# Patient Record
Sex: Female | Born: 1980 | Race: White | Hispanic: No | Marital: Married | Smoking: Never smoker
Health system: Southern US, Community
[De-identification: ages and names within clinical notes are randomized; demographics above are authoritative.]

## PROBLEM LIST (undated history)

## (undated) DIAGNOSIS — Z9889 Other specified postprocedural states: Secondary | ICD-10-CM

## (undated) DIAGNOSIS — R112 Nausea with vomiting, unspecified: Secondary | ICD-10-CM

## (undated) DIAGNOSIS — E7211 Homocystinuria: Secondary | ICD-10-CM

## (undated) DIAGNOSIS — R519 Headache, unspecified: Secondary | ICD-10-CM

## (undated) DIAGNOSIS — E7212 Methylenetetrahydrofolate reductase deficiency: Secondary | ICD-10-CM

## (undated) DIAGNOSIS — R51 Headache: Secondary | ICD-10-CM

## (undated) DIAGNOSIS — O039 Complete or unspecified spontaneous abortion without complication: Secondary | ICD-10-CM

## (undated) HISTORY — PX: WISDOM TOOTH EXTRACTION: SHX21

## (undated) HISTORY — DX: Headache, unspecified: R51.9

## (undated) HISTORY — DX: Headache: R51

## (undated) HISTORY — DX: Methylenetetrahydrofolate reductase deficiency: E72.12

## (undated) HISTORY — DX: Methylenetetrahydrofolate reductase deficiency: E72.11

## (undated) HISTORY — DX: Complete or unspecified spontaneous abortion without complication: O03.9

---

## 2013-01-31 ENCOUNTER — Other Ambulatory Visit: Payer: Self-pay | Admitting: Obstetrics and Gynecology

## 2013-02-01 ENCOUNTER — Encounter (HOSPITAL_COMMUNITY): Payer: Self-pay

## 2013-02-01 ENCOUNTER — Encounter (HOSPITAL_COMMUNITY)
Admission: RE | Disposition: A | Discharge status: Home / Self Care | Payer: Self-pay | Source: Ambulatory Visit | Attending: Obstetrics and Gynecology

## 2013-02-01 ENCOUNTER — Ambulatory Visit (HOSPITAL_COMMUNITY)
Admission: RE | Admit: 2013-02-01 | Discharge: 2013-02-01 | Disposition: A | Discharge status: Home / Self Care | Payer: BC Managed Care – PPO | Source: Ambulatory Visit | Attending: Obstetrics and Gynecology | Admitting: Obstetrics and Gynecology

## 2013-02-01 ENCOUNTER — Ambulatory Visit (HOSPITAL_COMMUNITY): Payer: BC Managed Care – PPO

## 2013-02-01 DIAGNOSIS — O021 Missed abortion: Secondary | ICD-10-CM | POA: Insufficient documentation

## 2013-02-01 HISTORY — DX: Other specified postprocedural states: Z98.890

## 2013-02-01 HISTORY — DX: Nausea with vomiting, unspecified: R11.2

## 2013-02-01 HISTORY — PX: DILATION AND EVACUATION: SHX1459

## 2013-02-01 SURGERY — DILATION AND EVACUATION, UTERUS
Anesthesia: Monitor Anesthesia Care | Site: Uterus | Wound class: Clean Contaminated

## 2013-02-01 MED ORDER — DEXAMETHASONE SODIUM PHOSPHATE 4 MG/ML IJ SOLN
INTRAMUSCULAR | Status: DC | PRN
  Administered 2013-02-01: 10 mg via INTRAVENOUS

## 2013-02-01 MED ORDER — METHYLERGONOVINE MALEATE 0.2 MG/ML IJ SOLN
INTRAMUSCULAR | Status: AC
  Filled 2013-02-01: qty 1

## 2013-02-01 MED ORDER — DEXAMETHASONE SODIUM PHOSPHATE 10 MG/ML IJ SOLN
INTRAMUSCULAR | Status: AC
  Filled 2013-02-01: qty 1

## 2013-02-01 MED ORDER — PROPOFOL 10 MG/ML IV EMUL
INTRAVENOUS | Status: AC
  Filled 2013-02-01: qty 20

## 2013-02-01 MED ORDER — KETOROLAC TROMETHAMINE 30 MG/ML IJ SOLN
INTRAMUSCULAR | Status: DC | PRN
  Administered 2013-02-01: 30 mg via INTRAMUSCULAR
  Administered 2013-02-01: 30 mg via INTRAVENOUS

## 2013-02-01 MED ORDER — LIDOCAINE HCL (CARDIAC) 20 MG/ML IV SOLN
INTRAVENOUS | Status: AC
  Filled 2013-02-01: qty 5

## 2013-02-01 MED ORDER — CHLOROPROCAINE HCL 1 % IJ SOLN
INTRAMUSCULAR | Status: AC
  Filled 2013-02-01: qty 30

## 2013-02-01 MED ORDER — ONDANSETRON HCL 4 MG/2ML IJ SOLN
INTRAMUSCULAR | Status: AC
  Filled 2013-02-01: qty 2

## 2013-02-01 MED ORDER — ONDANSETRON HCL 4 MG/2ML IJ SOLN
INTRAMUSCULAR | Status: DC | PRN
  Administered 2013-02-01: 4 mg via INTRAVENOUS

## 2013-02-01 MED ORDER — MIDAZOLAM HCL 2 MG/2ML IJ SOLN
INTRAMUSCULAR | Status: AC
  Filled 2013-02-01: qty 2

## 2013-02-01 MED ORDER — FENTANYL CITRATE 0.05 MG/ML IJ SOLN
INTRAMUSCULAR | Status: AC
  Filled 2013-02-01: qty 2

## 2013-02-01 MED ORDER — CHLOROPROCAINE HCL 1 % IJ SOLN
INTRAMUSCULAR | Status: DC | PRN
  Administered 2013-02-01: 20 mL

## 2013-02-01 MED ORDER — LACTATED RINGERS IV SOLN: INTRAVENOUS | Status: DC

## 2013-02-01 MED ORDER — MIDAZOLAM HCL 5 MG/5ML IJ SOLN
INTRAMUSCULAR | Status: DC | PRN
  Administered 2013-02-01: 2 mg via INTRAVENOUS

## 2013-02-01 MED ORDER — KETOROLAC TROMETHAMINE 30 MG/ML IJ SOLN
INTRAMUSCULAR | Status: AC
  Filled 2013-02-01: qty 1

## 2013-02-01 MED ORDER — PROPOFOL 10 MG/ML IV BOLUS
INTRAVENOUS | Status: DC | PRN
  Administered 2013-02-01 (×5): 50 mg via INTRAVENOUS

## 2013-02-01 MED ORDER — IBUPROFEN 800 MG PO TABS: 800.0000 mg | ORAL_TABLET | Freq: Three times a day (TID) | ORAL | Status: DC | PRN

## 2013-02-01 MED ORDER — DOXYCYCLINE HYCLATE 100 MG IV SOLR
100.0000 mg | INTRAVENOUS | Status: AC
  Administered 2013-02-01: 100 mg via INTRAVENOUS
  Filled 2013-02-01: qty 100

## 2013-02-01 MED ORDER — METHYLERGONOVINE MALEATE 0.2 MG/ML IJ SOLN
INTRAMUSCULAR | Status: DC | PRN
  Administered 2013-02-01: 0.2 mg via INTRAMUSCULAR

## 2013-02-01 MED ORDER — SODIUM CHLORIDE 0.9 % IR SOLN
Status: DC | PRN
  Administered 2013-02-01: 1000 mL

## 2013-02-01 MED ORDER — FENTANYL CITRATE 0.05 MG/ML IJ SOLN
INTRAMUSCULAR | Status: DC | PRN
  Administered 2013-02-01 (×2): 50 ug via INTRAVENOUS

## 2013-02-01 MED ORDER — LACTATED RINGERS IV SOLN
INTRAVENOUS | Status: DC
  Administered 2013-02-01: 50 mL/h via INTRAVENOUS
  Administered 2013-02-01: 09:00:00 via INTRAVENOUS

## 2013-02-01 SURGICAL SUPPLY — 20 items
CATH ROBINSON RED A/P 16FR (CATHETERS) ×2 IMPLANT
CLOTH BEACON ORANGE TIMEOUT ST (SAFETY) ×2 IMPLANT
DECANTER SPIKE VIAL GLASS SM (MISCELLANEOUS) ×2 IMPLANT
GLOVE BIO SURGEON STRL SZ 6.5 (GLOVE) ×2 IMPLANT
GLOVE BIOGEL PI IND STRL 7.0 (GLOVE) ×1 IMPLANT
GLOVE BIOGEL PI INDICATOR 7.0 (GLOVE) ×1
GOWN STRL REIN XL XLG (GOWN DISPOSABLE) ×4 IMPLANT
KIT BERKELEY 1ST TRIMESTER 3/8 (MISCELLANEOUS) ×2 IMPLANT
NEEDLE SPNL 22GX3.5 QUINCKE BK (NEEDLE) ×2 IMPLANT
NS IRRIG 1000ML POUR BTL (IV SOLUTION) ×2 IMPLANT
PACK VAGINAL MINOR WOMEN LF (CUSTOM PROCEDURE TRAY) ×2 IMPLANT
PAD OB MATERNITY 4.3X12.25 (PERSONAL CARE ITEMS) ×2 IMPLANT
PAD PREP 24X48 CUFFED NSTRL (MISCELLANEOUS) ×2 IMPLANT
SET BERKELEY SUCTION TUBING (SUCTIONS) ×2 IMPLANT
SYR CONTROL 10ML LL (SYRINGE) ×2 IMPLANT
TOWEL OR 17X24 6PK STRL BLUE (TOWEL DISPOSABLE) ×4 IMPLANT
VACURETTE 10 RIGID CVD (CANNULA) ×2 IMPLANT
VACURETTE 7MM CVD STRL WRAP (CANNULA) IMPLANT
VACURETTE 8 RIGID CVD (CANNULA) ×2 IMPLANT
VACURETTE 9 RIGID CVD (CANNULA) IMPLANT

## 2013-02-01 NOTE — Transfer of Care (Signed)
Immediate Anesthesia Transfer of Care Note  Patient: Brandy Kaiser  Procedure(s) Performed: Procedure(s) with comments: DILATATION AND EVACUATION (N/A) - Also send tissue for chromosomal analysis.  Patient Location: PACU  Anesthesia Type:MAC  Level of Consciousness: awake, alert  and oriented  Airway & Oxygen Therapy: Patient Spontanous Breathing  Post-op Assessment: Report given to PACU RN and Post -op Vital signs reviewed and stable  Post vital signs: Reviewed and stable  Complications: No apparent anesthesia complications

## 2013-02-01 NOTE — Brief Op Note (Signed)
02/01/2013  9:56 AM  PATIENT:  Brandy Kaiser  32 y.o. female  PRE-OPERATIVE DIAGNOSIS:  Missed Abortion, Recurrent Pregnancy Loss , IUP @ 9 3/7 weeks POST-OPERATIVE DIAGNOSIS:  missed abortion,recurrent pregnancy loss , IUP @ 9 3/7 weeks  PROCEDURE:  Suction Dilation and evacuation with chromosomal analysis  SURGEON:  Surgeon(s) and Role:    * Cathe Bilger Cathie Beams, MD - Primary  PHYSICIAN ASSISTANT:   ASSISTANTS:none  ANESTHESIA:    paracervical block and MAC Findings; large amt of POC ? Arcuate vs septated uterus. RV uterus, 8-9 weeks EBL:  50cc  BLOOD ADMINISTERED:none  DRAINS: none   LOCAL MEDICATIONS USED:  OTHER 1% nesicaine  SPECIMEN:  Source of Specimen:  POC, tissue for chromosomal studies  DISPOSITION OF SPECIMEN:  PATHOLOGY  COUNTS:  YES  TOURNIQUET:  * No tourniquets in log *  DICTATION: .Other Dictation: Dictation Number [id number]  PLAN OF CARE: Discharge to home after PACU  PATIENT DISPOSITION:  PACU - hemodynamically stable.   Delay start of Pharmacological VTE agent (>24hrs) due to surgical blood loss or risk of bleeding: no

## 2013-02-01 NOTE — Anesthesia Preprocedure Evaluation (Signed)
Anesthesia Evaluation  Patient identified by MRN, date of birth, ID band Patient awake    Reviewed: Allergy & Precautions, H&P , NPO status , Patient's Chart, lab work & pertinent test results  Airway Mallampati: I TM Distance: >3 FB Neck ROM: full    Dental no notable dental hx. (+) Teeth Intact   Pulmonary neg pulmonary ROS,    Pulmonary exam normal       Cardiovascular negative cardio ROS      Neuro/Psych negative neurological ROS  negative psych ROS   GI/Hepatic negative GI ROS, Neg liver ROS,   Endo/Other  negative endocrine ROS  Renal/GU negative Renal ROS  negative genitourinary   Musculoskeletal negative musculoskeletal ROS (+)   Abdominal Normal abdominal exam  (+)   Peds negative pediatric ROS (+)  Hematology negative hematology ROS (+)   Anesthesia Other Findings   Reproductive/Obstetrics negative OB ROS                           Anesthesia Physical Anesthesia Plan  ASA: I  Anesthesia Plan: MAC   Post-op Pain Management:    Induction: Intravenous  Airway Management Planned:   Additional Equipment:   Intra-op Plan:   Post-operative Plan:   Informed Consent: I have reviewed the patients History and Physical, chart, labs and discussed the procedure including the risks, benefits and alternatives for the proposed anesthesia with the patient or authorized representative who has indicated his/her understanding and acceptance.     Plan Discussed with: CRNA and Surgeon  Anesthesia Plan Comments:         Anesthesia Quick Evaluation  

## 2013-02-01 NOTE — OR Nursing (Signed)
Chromosome studies sent to lab.

## 2013-02-01 NOTE — Anesthesia Postprocedure Evaluation (Signed)
Anesthesia Post Note  Patient: Brandy Kaiser  Procedure(s) Performed: Procedure(s) (LRB): DILATATION AND EVACUATION (N/A)  Anesthesia type: MAC  Patient location: PACU  Post pain: Pain level controlled  Post assessment: Post-op Vital signs reviewed  Last Vitals:  Filed Vitals:   02/01/13 1015  BP: 92/56  Pulse: 68  Temp: 36.9 C  Resp: 18    Post vital signs: Reviewed  Level of consciousness: sedated  Complications: No apparent anesthesia complications

## 2013-02-03 ENCOUNTER — Encounter (HOSPITAL_COMMUNITY): Payer: Self-pay | Admitting: Obstetrics and Gynecology

## 2013-02-03 NOTE — Op Note (Signed)
NAME:  Brandy Kaiser, Brandy Kaiser              ACCOUNT NO.:  [id number]  MEDICAL RECORD NO.:  [id number]  LOCATION:                                 FACILITY:  PHYSICIAN:  Maxie Better, M.D.DATE OF BIRTH:  1981-09-15  DATE OF PROCEDURE:  02/01/2013 DATE OF DISCHARGE:                              OPERATIVE REPORT   PREOPERATIVE DIAGNOSIS:  Missed abortion, recurrent pregnancy loss, intrauterine gestation at 43 and 3/7th weeks.  POSTOPERATIVE DIAGNOSIS:  Missed abortion, recurrent pregnancy loss.  PROCEDURE:  Suction, dilation, and evacuation with chromosomal analysis.  ANESTHESIA:  MAC, paracervical block.  SURGEON:  Maxie Better, MD  ASSISTANT:  None.  PROCEDURE IN DETAIL:  Under adequate monitored anesthesia, the patient was placed in a dorsal lithotomy position.  She was sterilely prepped and draped in usual fashion.  Examination under anesthesia revealed a retroverted, 8-9 week size uterus.  No adnexal masses could be appreciated.  The bladder was catheterized for scant amount of urine. Bivalve speculum was placed in the vagina and single-tooth tenaculum was placed on the anterior lip of the cervix.  A 20 mL of 1% Nesacaine was injected paracervically at the 3 and 9 o'clock position.  The cervix was then serially dilated up to #31 Pratt dilator and #10 mm curved suction cannula was gently introduced into the uterine cavity.  Large amount of products of conception was obtained.  The cannula was removed.  The cavity was curetted gently, still felt to have some additional tissue. A #8 mm suction cannula was then introduced.  The cavity once again was suctioned out.  It was then removed.  The medium curette was introduced and in the right horn of the uterus was some more tissue was found and using the 8 mm suction cannula along with this gently curetting, the tissue was removed in that right side of the uterus and on the left side as well, it was checked.  There was a question  as to whether or not the patient has an arcuate uterus or a septated uterus.  Nonetheless, after all tissue was felt to be removed in combination with a suction and curettage, all instruments were then removed from the vagina.  Specimen labeled.  Products of conception portion sent, chromosomal analysis was done.  Estimated blood loss was about 30 mL. Intraoperative fluid 1200 mL.  Urine output was 200 mL.  Complication was none.  Blood type was O positive.  The patient tolerated the procedure well, was transferred to recovery room in stable condition.    Maxie Better, M.D.    [place]/MEDQ  D:  02/01/2013  T:  02/01/2013  Job:  [id number]

## 2013-03-28 ENCOUNTER — Telehealth: Payer: Self-pay | Admitting: Oncology

## 2013-03-28 NOTE — Telephone Encounter (Signed)
S/W PT IN REF TO NP APPT. 04/21/13@10 :30 REFERRING DR COUSINS DX-FACTOR V DEFICIENCY AND LOW PROTEIN MAILED NP PACKET

## 2013-04-03 ENCOUNTER — Telehealth: Payer: Self-pay | Admitting: Oncology

## 2013-04-03 NOTE — Telephone Encounter (Signed)
C/D 04/03/13 for appt. 04/21/13

## 2013-04-21 ENCOUNTER — Ambulatory Visit: Payer: BC Managed Care – PPO

## 2013-04-21 ENCOUNTER — Encounter: Payer: Self-pay | Admitting: Oncology

## 2013-04-21 ENCOUNTER — Ambulatory Visit (HOSPITAL_BASED_OUTPATIENT_CLINIC_OR_DEPARTMENT_OTHER): Payer: BC Managed Care – PPO | Admitting: Oncology

## 2013-04-21 ENCOUNTER — Other Ambulatory Visit (HOSPITAL_BASED_OUTPATIENT_CLINIC_OR_DEPARTMENT_OTHER): Payer: BC Managed Care – PPO | Admitting: Lab

## 2013-04-21 VITALS — BP 90/54 | HR 59 | Temp 98.2°F | Resp 18 | Ht 64.5 in | Wt 112.4 lb

## 2013-04-21 DIAGNOSIS — D6859 Other primary thrombophilia: Secondary | ICD-10-CM

## 2013-04-21 DIAGNOSIS — D6851 Activated protein C resistance: Secondary | ICD-10-CM

## 2013-04-21 LAB — COMPREHENSIVE METABOLIC PANEL (CC13)
BUN: 11.6 mg/dL (ref 7.0–26.0)
CO2: 22 mEq/L (ref 22–29)
Calcium: 9.4 mg/dL (ref 8.4–10.4)
Chloride: 107 mEq/L (ref 98–107)
Creatinine: 0.8 mg/dL (ref 0.6–1.1)
Total Bilirubin: 1.26 mg/dL — ABNORMAL HIGH (ref 0.20–1.20)

## 2013-04-21 LAB — CBC WITH DIFFERENTIAL/PLATELET
Basophils Absolute: 0.1 10*3/uL (ref 0.0–0.1)
Eosinophils Absolute: 0 10*3/uL (ref 0.0–0.5)
HGB: 12.4 g/dL (ref 11.6–15.9)
LYMPH%: 18.3 % (ref 14.0–49.7)
MCV: 92.7 fL (ref 79.5–101.0)
MONO#: 0.9 10*3/uL (ref 0.1–0.9)
MONO%: 6.7 % (ref 0.0–14.0)
NEUT#: 9.8 10*3/uL — ABNORMAL HIGH (ref 1.5–6.5)
Platelets: 181 10*3/uL (ref 145–400)
WBC: 13.1 10*3/uL — ABNORMAL HIGH (ref 3.9–10.3)

## 2013-04-21 LAB — PROTIME-INR: INR: 1 — ABNORMAL LOW (ref 2.00–3.50)

## 2013-04-21 NOTE — Progress Notes (Signed)
Checked in new pt with no financial concerns. °

## 2013-04-21 NOTE — Progress Notes (Signed)
[facility]  [phone] [phone]     INITIAL HEMATOLOGY CONSULTATION    Referral MD:  Maxie Better, M.D.   Reason for Referral:  Recurrent miscarriages.     HPI: [name] is a 32 yo woman with no significant PMH.  Her first pregnancy went well and she had a healthy baby boy.  Her last two pregnancies resulted in miscarriages at 5 weeks of gestation in 01/2012; and then at 10 weeks in [id number]. Products of gestations both times showed normal cytogenetics.  Dr. Cherly Hensen did not think there was any obvious gynecological explanation for her recurrent miscarriages.  Therefore, thrombophlilia work up was sent.  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  She was kindly referred to the [facility] for evaluation.   Brandy Kaiser presented to the clinic for the first time today with her husband.  She reports feeling well.  She is slightly thin but she has good appetite and denied anorexia, bulimia.  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  She denied every having history of thrombosis.  The rest of the 14-point review of system was negative.      Past Medical History  Diagnosis Date  . PONV (postoperative nausea and vomiting)   . Miscarriage   :    Past Surgical History  Procedure Laterality Date  . Cesarean section      2011  . Wisdom tooth extraction      july 2012  . Dilation and evacuation N/A 02/01/2013    Procedure: DILATATION AND EVACUATION;  Surgeon: Serita Kyle, MD;  Location: [facility];  Service: Gynecology;  Laterality: N/A;  Also send tissue for chromosomal analysis.  :   CURRENT MEDS: Current Outpatient Prescriptions  Medication Sig Dispense Refill  . folic acid (FOLVITE) 1 MG tablet Take 2 mg by mouth daily.       . Probiotic Product (PROBIOTIC DAILY PO) Take 1 capsule by mouth daily.      . Pyridoxine HCl (VITAMIN B-6 PO) Take 1 tablet by mouth  3 (three) times daily.       No current facility-administered medications for this visit.      No Known Allergies:  Family History  Problem Relation Age of Onset  . Cancer Paternal Grandfather     prostate  :  History   Social History  . Marital Status: Married    Spouse Name: N/A    Number of Children: 1  . Years of Education: N/A   Occupational History  .      house wife.    Social History Main Topics  . Smoking status: Never Smoker   . Smokeless tobacco: Never Used  . Alcohol Use: No  . Drug Use: No  . Sexually Active: Yes   Other Topics Concern  . Not on file   Social History Narrative  . No narrative on file  :  REVIEW OF SYSTEM:  The rest of the 14-point review of sytem was negative.   Exam: ECOG 0.   General:  Thin-appearing woman, in no acute distress.  Eyes:  no scleral icterus.  ENT:  There were no oropharyngeal lesions.  Neck was without thyromegaly.  Lymphatics:  Negative cervical, supraclavicular or axillary adenopathy.  Respiratory: lungs were clear bilaterally without wheezing or crackles.  Cardiovascular:  Regular rate and rhythm, S1/S2, without murmur, rub or gallop.  There was no pedal  edema.  GI:  abdomen was soft, flat, nontender, nondistended, without organomegaly.  Muscoloskeletal:  no spinal tenderness of palpation of vertebral spine.  Skin exam was without echymosis, petichae.  Neuro exam was nonfocal.  Patient was able to get on and off exam table without assistance.  Gait was normal.  Patient was alert and oriented.  Attention was good.   Language was appropriate.  Mood was normal without depression.  Speech was not pressured.  Thought content was not tangential.    LABS:  Lab Results  Component Value Date   WBC 13.1* 04/21/2013   HGB 12.4 04/21/2013   HCT 37.5 04/21/2013   PLT 181 04/21/2013   INR 1.00* 04/21/2013    ASSESSMENT AND PLAN:   A 32 yo woman with recurrent miscarriages with no family history of recurrent miscarriages.  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  Compared to antithrombin deficiency, or antilupus antibody, or homozygous factor V Leiden, her mutations have relative low risk of recurrent thrombosis.  Case series reports have been controversial about whether heterozygous factor V Leiden increases the risk of thrombosis.  However, combined with heterozygous MTFHR mutation and slightly decreased protein C, Mrs. Lovering is at slightly increased risk than the general population for recurrent thrombosis.  She never had documented thrombosis.  However, according to her gynecologist, there was no other explanation for her recurrent miscarriages.   Recommendation: - slight healthy weight gain by about 10-15 pounds. - agree with prophylactic anticoagulation with Lovenox 40mg  SQ daily as soon as she is documented to be pregnant with the hope to decrease the risk of miscarriage.  I explained with her and her husband the potential risk of Lovenox which include but not limited to bleeding.  She expressed informed understanding but is leaning toward taking Lovenox.  She will contact clinic for return appointment when she is pregnant.   Thank you for this referral.    The length of time of the face-to-face encounter was 30 minutes. More than 50% of time was spent counseling and coordination of care.

## 2013-07-15 ENCOUNTER — Inpatient Hospital Stay (HOSPITAL_COMMUNITY): Admission: AD | Admit: 2013-07-15 | Payer: Self-pay | Source: Ambulatory Visit | Admitting: Obstetrics and Gynecology

## 2013-12-11 NOTE — L&D Delivery Note (Signed)
Delivery Note At 5:09 AM a viable and healthy female was delivered via VBAC, Spontaneous (Presentation: Left Occiput Anterior).  APGAR: 8, 9; weight pending.   Placenta status: Intact, Spontaneous.  Cord: 3 vessels with the following complications: None Long.  Cord pH: none  Anesthesia: Epidural  Episiotomy: None Lacerations: 2nd degree;Perineal Suture Repair: 3.0 chromic Est. Blood Loss (mL): 300  Mom to postpartum.  Baby to Couplet care / Skin to Skin.  Decklan Mau A 10/10/2014, 5:46 AM

## 2014-01-24 ENCOUNTER — Ambulatory Visit: Payer: BC Managed Care – PPO

## 2014-01-24 ENCOUNTER — Ambulatory Visit (INDEPENDENT_AMBULATORY_CARE_PROVIDER_SITE_OTHER): Payer: BC Managed Care – PPO | Admitting: Internal Medicine

## 2014-01-24 VITALS — BP 104/70 | HR 76 | Temp 98.9°F | Resp 18 | Ht 65.5 in | Wt 113.1 lb

## 2014-01-24 DIAGNOSIS — R51 Headache: Secondary | ICD-10-CM

## 2014-01-24 DIAGNOSIS — G4763 Sleep related bruxism: Secondary | ICD-10-CM

## 2014-01-24 MED ORDER — CYCLOBENZAPRINE HCL 10 MG PO TABS: 10.0000 mg | ORAL_TABLET | Freq: Every day | ORAL | Status: DC

## 2014-01-24 NOTE — Progress Notes (Signed)
This chart was scribed for Brandy Siaobert Merwyn Hodapp, MD by Luisa DagoPriscilla Tutu, ED Scribe. This patient was seen in room 11 and the patient's care was started at 10:38 AM. Subjective:    Patient ID: Brandy Kaiser, female    DOB: [birth year], 33 y.o.   MRN: [id number]  Chief Complaint  Patient presents with  . 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 Pt states that she went to the [facility] [place] [address] and was prescribed Prednisone, but this had no effect. She's concerned that her symptoms may be due to a sinus infection. She is complaining of no visual disturbances. No nausea or vomiting. No dizziness. Noaura. Headache present on awakening can last all day with some mild response to ibuprofen. No nocturnal headaches. No trouble with gait. No past history of headaches. No current nasal discharge or sneezing or coughing, and no fever.   Pt reports having 2 miscarriages last summer so she's been on supplemental folic acid. It would be if she got pregnant. Last menstrual period end of January.   Pt states that the headache does not keep her from exercising. Denies any sleep disturbance, congestion, cough, sore throat, allergic rhinitis or nausea.    She admits some recent stress related problems with in-depth conversations with her husband about his extensive travel, and they are making some adjustments which cause her to have a positive outlook. She has had some increased Teeth grinding according to her dentist. She denies panic attacks. She does feel overwhelmed with the things expected of her. She has  not had insomnia. She denies overt depression or anxiety. Mild irritability but no anger.  She exercises without any undue problems.  There are no active problems to display for this patient.  Past Medical History  Diagnosis Date    . PONV (postoperative nausea and vomiting)   . Miscarriage    Past Surgical History  Procedure Laterality Date  . Cesarean section      2011  . Wisdom tooth extraction      july 2012  . Dilation and evacuation N/A 02/01/2013    Procedure: DILATATION AND EVACUATION;  Surgeon: Serita KyleSheronette A Cousins, MD;  Location: [facility];  Service: Gynecology;  Laterality: N/A;  Also send tissue for chromosomal analysis.   No Known Allergies Prior to Admission medications   Medication Sig Start Date End Date Taking? Authorizing Provider  folic acid (FOLVITE) 1 MG tablet Take 2 mg by mouth daily.  04/11/13  Yes Historical Provider, MD  Prenatal Vit-Fe Fumarate-FA (PRENATAL MULTIVITAMIN) TABS tablet Take 1 tablet by mouth daily at 12 noon.   Yes Historical Provider, MD  Probiotic Product (PROBIOTIC DAILY PO) Take 1 capsule by mouth daily.   Yes Historical Provider, MD  Pyridoxine HCl (VITAMIN B-6 PO) Take 1 tablet by mouth 3 (three) times daily.   Yes Historical Provider, MD   History   Social History  . Marital Status: Married    Spouse Name: N/A    Number of Children: 1  . Years of Education: N/A   Occupational History  .      house wife.    Social History Main Topics  . Smoking status: Never Smoker   . Smokeless tobacco: Never Used  . Alcohol Use: Yes     Comment: 2-3/week  .  Drug Use: No  . Sexual Activity: Yes   Other Topics Concern  . Not on file   Social History Narrative  . No narrative on file     Review of Systems A complete 10 system review of systems was obtained and all systems are negative except as noted in the HPI and PMH.      Objective:   Physical Exam  Nursing note and vitals reviewed. Constitutional: She is oriented to person, place, and time. She appears well-developed and well-nourished. No distress.  HENT:  Head: Normocephalic and atraumatic.  Right Ear: External ear normal.  Left Ear: External ear normal.  Nose: Right sinus exhibits frontal sinus  tenderness. Right sinus exhibits no maxillary sinus tenderness. Left sinus exhibits frontal sinus tenderness. Left sinus exhibits no maxillary sinus tenderness.  Mouth/Throat: Oropharynx is clear and moist. No oropharyngeal exudate.  There is no rhinorrhea nares are not boggy or swollen.  Eyes: Conjunctivae and EOM are normal. Pupils are equal, round, and reactive to light. Right eye exhibits no discharge.  Neck: Normal range of motion. Neck supple. No thyromegaly present.  Cardiovascular: Normal rate, regular rhythm, normal heart sounds and intact distal pulses.   No murmur heard. Pulmonary/Chest: Effort normal and breath sounds normal. No respiratory distress. She has no wheezes. She has no rales.  Musculoskeletal: Normal range of motion. She exhibits no edema and no tenderness.  Lymphadenopathy:    She has no cervical adenopathy.  Neurological: She is alert and oriented to person, place, and time. She has normal reflexes. No cranial nerve deficit.  Skin: Skin is warm and dry.  Psychiatric: She has a normal mood and affect. Her behavior is normal.     Filed Vitals:   01/24/14 0928  BP: 104/70  Pulse: 76  Temp: 98.9 F (37.2 C)  TempSrc: Oral  Resp: 18  Height: 5' 5.5" (1.664 m)  Weight: 113 lb 2 oz (51.313 kg)  SpO2: 99%   Primary X-ray (Sinus) reading by Dr. Merla Riches Findings: no air fluid level       Assessment & Plan:  ONGEXBMW(413.2) - Plan: DG SinUS 1-2 Views  Sleep related teeth grinding  This may be muscle contraction headache of stress origin Trial of Flexeril at bedtime Long discussion regarding origin of symptoms Discuss ways to reroute her commitments  Meds ordered this encounter  Medications  . Prenatal Vit-Fe Fumarate-FA (PRENATAL MULTIVITAMIN) TABS tablet    Sig: Take 1 tablet by mouth daily at 12 noon.  . cyclobenzaprine (FLEXERIL) 10 MG tablet    Sig: Take 1 tablet (10 mg total) by mouth at bedtime.    Dispense:  30 tablet    Refill:  0    Followup 2-4 weeks   I have completed the patient encounter in its entirety as documented by the scribe, with editing by me where necessary. Malaiya Paczkowski P. Merla Riches, M.D.

## 2014-02-02 ENCOUNTER — Telehealth: Payer: Self-pay | Admitting: *Deleted

## 2014-02-02 NOTE — Telephone Encounter (Signed)
[facility] [place] [facility] Dr. Cherly Hensenousins.  Pt seen by them today and confirmed pregnancy aprox 4 weeks.  French Anaracy asking if pt needs to be seen sooner than Monday 3/02 as scheduled?  Asking if pt needs to start on lovenox before next week?   Reviewed w/ Dr. Bertis RuddyGorsuch and she states ok for pt to wait until appt w/ her on 3/02.  Notified French Anaracy of Dr. Maxine GlennGorsuch's response and she verbalized understanding.

## 2014-02-09 ENCOUNTER — Ambulatory Visit (HOSPITAL_BASED_OUTPATIENT_CLINIC_OR_DEPARTMENT_OTHER): Payer: BC Managed Care – PPO | Admitting: Hematology and Oncology

## 2014-02-09 ENCOUNTER — Encounter: Payer: Self-pay | Admitting: Hematology and Oncology

## 2014-02-09 ENCOUNTER — Telehealth: Payer: Self-pay | Admitting: Hematology and Oncology

## 2014-02-09 VITALS — BP 102/64 | HR 86 | Temp 98.1°F | Resp 18 | Ht 65.0 in | Wt 113.0 lb

## 2014-02-09 DIAGNOSIS — O99119 Other diseases of the blood and blood-forming organs and certain disorders involving the immune mechanism complicating pregnancy, unspecified trimester: Principal | ICD-10-CM

## 2014-02-09 DIAGNOSIS — D6859 Other primary thrombophilia: Secondary | ICD-10-CM

## 2014-02-09 DIAGNOSIS — Z331 Pregnant state, incidental: Secondary | ICD-10-CM

## 2014-02-09 DIAGNOSIS — D689 Coagulation defect, unspecified: Secondary | ICD-10-CM

## 2014-02-09 MED ORDER — ENOXAPARIN SODIUM 40 MG/0.4ML ~~LOC~~ SOLN: 40.0000 mg | SUBCUTANEOUS | Status: DC

## 2014-02-09 NOTE — Progress Notes (Signed)
Instructed pt on self administering lovenox.  Pt will use her thighs for injections due to pregnancy.  Referred pt to [email or web address] for more detailed instructions and step by step photos.  Pt demonstrated administering lovenox injection to top of right thigh during visit and verbalized understanding of daily injections.  Sharps container and alcohol wipes given for pt to take home.  Instructed pt to call nurse with any questions or concerns.

## 2014-02-09 NOTE — Progress Notes (Signed)
[facility] OFFICE PROGRESS NOTE  No PCP Per Patient DIAGNOSIS:  History of recurrent miscarriages, factor V Leiden mutation, currently pregnant  SUMMARY OF HEMATOLOGIC HISTORY: This is a patient who was seen before for history of recurrent miscarriages. The patient have 1 normal pregnancy. Subsequently, she had 2 miscarriages, first one in the first trimester and second one around 10 weeks. Both gestations showed normal cytogenetics. Thrombophilia workup showed she was positive for factor V Leiden mutation, positive for MTHFR mutation and low protein C level. INTERVAL HISTORY: Brandy Kaiser 33 y.o. female returns for return visit. She is currently [redacted] weeks pregnant. She is concerned about recurrent miscarriages and was encouraged to make an appointment for consideration for Lovenox injection to prevent risk of recurrent miscarriages. She is feeling well otherwise. I have reviewed the past medical history, past surgical history, social history and family history with the patient and they are unchanged from previous note.  ALLERGIES:  has No Known Allergies.  MEDICATIONS:  Current Outpatient Prescriptions  Medication Sig Dispense Refill  . folic acid (FOLVITE) 1 MG tablet Take 4 mg by mouth daily.       . Prenatal Vit-Fe Fumarate-FA (PRENATAL MULTIVITAMIN) TABS tablet Take 1 tablet by mouth daily at 12 noon.      . Probiotic Product (PROBIOTIC DAILY PO) Take 1 capsule by mouth daily.      . Pyridoxine HCl (VITAMIN B-6 PO) Take 1 tablet by mouth 3 (three) times daily.      Marland Kitchen. enoxaparin (LOVENOX) 40 MG/0.4ML injection Inject 0.4 mLs (40 mg total) into the skin daily.  30 Syringe  8   No current facility-administered medications for this visit.     REVIEW OF SYSTEMS:   Constitutional: Denies fevers, chills or night sweats Eyes: Denies blurriness of vision Ears, nose, mouth, throat, and face: Denies mucositis or sore throat Respiratory: Denies cough, dyspnea or  wheezes Cardiovascular: Denies palpitation, chest discomfort or lower extremity swelling Gastrointestinal:  Denies nausea, heartburn or change in bowel habits Skin: Denies abnormal skin rashes Lymphatics: Denies new lymphadenopathy or easy bruising Neurological:Denies numbness, tingling or new weaknesses Behavioral/Psych: Mood is stable, no new changes  All other systems were reviewed with the patient and are negative.  PHYSICAL EXAMINATION: ECOG PERFORMANCE STATUS: 0 - Asymptomatic  Filed Vitals:   02/09/14 0938  BP: 102/64  Pulse: 86  Temp: 98.1 F (36.7 C)  Resp: 18   Filed Weights   02/09/14 0938  Weight: 113 lb (51.256 kg)    GENERAL:alert, no distress and comfortable SKIN: skin color, texture, turgor are normal, no rashes or significant lesions EYES: normal, Conjunctiva are pink and non-injected, sclera clear OROPHARYNX:no exudate, no erythema and lips, buccal mucosa, and tongue normal  NECK: supple, thyroid normal size, non-tender, without nodularity LYMPH:  no palpable lymphadenopathy in the cervical, axillary or inguinal LUNGS: clear to auscultation and percussion with normal breathing effort HEART: regular rate & rhythm and no murmurs and no lower extremity edema ABDOMEN:abdomen soft, non-tender and normal bowel sounds Musculoskeletal:no cyanosis of digits and no clubbing  NEURO: alert & oriented x 3 with fluent speech, no focal motor/sensory deficits  LABORATORY DATA:  I have reviewed the data as listed No results found for this or any previous visit (from the past 48 hour(s)).  Lab Results  Component Value Date   WBC 13.1* 04/21/2013   HGB 12.4 04/21/2013   HCT 37.5 04/21/2013   MCV 92.7 04/21/2013   PLT 181 04/21/2013   ASSESSMENT &  PLAN:  #1 history of recurrent miscarriages #2 positive factor V Leiden mutation, heterozygous #3 positive for MT HFR mutation, heterozygous #4 low protein C level #5 new pregnancy at first trimester I discussed with the  patient and the use of prophylactic low molecular weight heparin to prevent recurrent miscarriages. The risk and benefit of Lovenox as discussed with the patient and she agreed to proceed. I have instructed my nurse to teach the patient self administration of Lovenox. I plan to see her back in 2 months with repeat blood work, history and physical examination. My plan would be for her to continue on Lovenox until 24-48 hours prior to planned delivery, and to continue postpartum for 4-6 weeks after delivery due to high-risk of postpartum DVT.  All questions were answered. The patient knows to call the clinic with any problems, questions or concerns. No barriers to learning was detected.  I spent 40 minutes counseling the patient face to face. The total time spent in the appointment was 60 minutes and more than 50% was on counseling.     [facility], Birney Belshe, MD 02/09/2014 4:47 PM

## 2014-02-09 NOTE — Telephone Encounter (Signed)
Gave appt calendat for lab and MD to pt today

## 2014-02-09 NOTE — Patient Instructions (Signed)
Enoxaparin injection °What is this medicine? °ENOXAPARIN (ee nox a PA rin) is used after knee, hip, or abdominal surgeries to prevent blood clotting. It is also used to treat existing blood clots in the lungs or in the veins. °This medicine may be used for other purposes; ask your health care provider or pharmacist if you have questions. °COMMON BRAND NAME(S): Lovenox °What should I tell my health care provider before I take this medicine? °They need to know if you have any of these conditions: °-bleeding disorders, hemorrhage, or hemophilia °-infection of the heart or heart valves °-kidney or liver disease °-previous stroke °-prosthetic heart valve °-recent surgery or delivery of a baby °-ulcer in the stomach or intestine, diverticulitis, or other bowel disease °-an unusual or allergic reaction to enoxaparin, heparin, pork or pork products, other medicines, foods, dyes, or preservatives °-pregnant or trying to get pregnant °-breast-feeding °How should I use this medicine? °This medicine is for injection under the skin. It is usually given by a health-care professional. You or a family member may be trained on how to give the injections. If you are to give yourself injections, make sure you understand how to use the syringe, measure the dose if necessary, and give the injection. To avoid bruising, do not rub the site where this medicine has been injected. Do not take your medicine more often than directed. Do not stop taking except on the advice of your doctor or health care professional. °Make sure you receive a puncture-resistant container to dispose of the needles and syringes once you have finished with them. Do not reuse these items. Return the container to your doctor or health care professional for proper disposal. °Talk to your pediatrician regarding the use of this medicine in children. Special care may be needed. °Overdosage: If you think you have taken too much of this medicine contact a poison control  center or emergency room at once. °NOTE: This medicine is only for you. Do not share this medicine with others. °What if I miss a dose? °If you miss a dose, take it as soon as you can. If it is almost time for your next dose, take only that dose. Do not take double or extra doses. °What may interact with this medicine? °Do not take this medicine with any of the following medications: °-aspirin and aspirin-like medicines °-heparin °-mifepristone °-palifermin °-warfarin  °This medicine may also interact with the following medications: °-cilostazol °-clopidogrel °-dipyridamole °-NSAIDs, medicines for pain and inflammation, like ibuprofen or naproxen °-sulfinpyrazone °-ticlopidine °This list may not describe all possible interactions. Give your health care provider a list of all the medicines, herbs, non-prescription drugs, or dietary supplements you use. Also tell them if you smoke, drink alcohol, or use illegal drugs. Some items may interact with your medicine. °What should I watch for while using this medicine? °Visit your doctor or health care professional for regular checks on your progress. Your condition will be monitored carefully while you are receiving this medicine. °Notify your doctor or health care professional and seek emergency treatment if you develop breathing problems; changes in vision; chest pain; severe, sudden headache; pain, swelling, warmth in the leg; trouble speaking; sudden numbness or weakness of the face, arm, or leg. These can be signs that your condition has gotten worse. °If you are going to have surgery, tell your doctor or health care professional that you are taking this medicine. °Do not stop taking this medicine without first talking to your doctor. Be sure to refill your prescription   before you run out of medicine. °Avoid sports and activities that might cause injury while you are using this medicine. Severe falls or injuries can cause unseen bleeding. Be careful when using sharp  tools or knives. Consider using an electric razor. Take special care brushing or flossing your teeth. Report any injuries, bruising, or red spots on the skin to your doctor or health care professional. °What side effects may I notice from receiving this medicine? °Side effects that you should report to your doctor or health care professional as soon as possible: °-allergic reactions like skin rash, itching or hives, swelling of the face, lips, or tongue °-feeling faint or lightheaded, falls °-signs and symptoms of bleeding such as bloody or black, tarry stools; red or dark-brown urine; spitting up blood or brown material that looks like coffee grounds; red spots on the skin; unusual bruising or bleeding from the eye, gums, or nose  °Side effects that usually do not require medical attention (report to your doctor or health care professional if they continue or are bothersome): °-pain, redness, or irritation at site where injected °This list may not describe all possible side effects. Call your doctor for medical advice about side effects. You may report side effects to FDA at 1-800-FDA-1088. °Where should I keep my medicine? °Keep out of the reach of children. °Store at room temperature between 15 and 30 degrees C (59 and 86 degrees F). Do not freeze. If your injections have been specially prepared, you may need to store them in the refrigerator. Ask your pharmacist. Throw away any unused medicine after the expiration date. °NOTE: This sheet is a summary. It may not cover all possible information. If you have questions about this medicine, talk to your doctor, pharmacist, or health care provider. °© 2014, [employer]. (2013-03-25 16:13:24) ° °

## 2014-03-11 ENCOUNTER — Telehealth: Payer: Self-pay | Admitting: *Deleted

## 2014-03-11 LAB — OB RESULTS CONSOLE RPR: RPR: NONREACTIVE

## 2014-03-11 LAB — OB RESULTS CONSOLE HIV ANTIBODY (ROUTINE TESTING): HIV: NONREACTIVE

## 2014-03-11 LAB — OB RESULTS CONSOLE ABO/RH: "RH Type ": POSITIVE

## 2014-03-11 LAB — OB RESULTS CONSOLE HEPATITIS B SURFACE ANTIGEN: Hepatitis B Surface Ag: NEGATIVE

## 2014-03-11 LAB — OB RESULTS CONSOLE ANTIBODY SCREEN: Antibody Screen: NEGATIVE

## 2014-03-11 LAB — OB RESULTS CONSOLE RUBELLA ANTIBODY, IGM: Rubella: NON-IMMUNE/NOT IMMUNE

## 2014-03-11 NOTE — Telephone Encounter (Signed)
Pt left VM states needs some new sharps containers for her lovenox syringes.  She dropped off a full one at her OB/GYN but they did not have any containers to give her.  Called pt back and left VM she can come by to pick up containers. Will keep them at nurse's desk.

## 2014-03-19 LAB — OB RESULTS CONSOLE GC/CHLAMYDIA
Chlamydia: NEGATIVE
Gonorrhea: NEGATIVE

## 2014-03-26 ENCOUNTER — Telehealth: Payer: Self-pay | Admitting: *Deleted

## 2014-03-26 NOTE — Telephone Encounter (Signed)
Pt called to report her OB suggests she starts on Zantac 150 mg daily for morning sickness. She asks if this is ok to take w/ lovenox?  Informed pt it is ok to take zantac while on lovenox,  No interactions found.  She verbalized understanding.

## 2014-04-13 ENCOUNTER — Telehealth: Payer: Self-pay | Admitting: Hematology and Oncology

## 2014-04-13 ENCOUNTER — Ambulatory Visit (HOSPITAL_BASED_OUTPATIENT_CLINIC_OR_DEPARTMENT_OTHER): Payer: BC Managed Care – PPO | Admitting: Hematology and Oncology

## 2014-04-13 ENCOUNTER — Other Ambulatory Visit (HOSPITAL_BASED_OUTPATIENT_CLINIC_OR_DEPARTMENT_OTHER): Payer: BC Managed Care – PPO

## 2014-04-13 ENCOUNTER — Telehealth: Payer: Self-pay | Admitting: *Deleted

## 2014-04-13 VITALS — BP 99/56 | HR 84 | Temp 98.2°F | Resp 18 | Ht 65.0 in | Wt 116.4 lb

## 2014-04-13 DIAGNOSIS — O99119 Other diseases of the blood and blood-forming organs and certain disorders involving the immune mechanism complicating pregnancy, unspecified trimester: Principal | ICD-10-CM

## 2014-04-13 DIAGNOSIS — D689 Coagulation defect, unspecified: Secondary | ICD-10-CM

## 2014-04-13 DIAGNOSIS — D6859 Other primary thrombophilia: Secondary | ICD-10-CM

## 2014-04-13 DIAGNOSIS — D649 Anemia, unspecified: Secondary | ICD-10-CM

## 2014-04-13 DIAGNOSIS — O99013 Anemia complicating pregnancy, third trimester: Secondary | ICD-10-CM | POA: Insufficient documentation

## 2014-04-13 LAB — CBC WITH DIFFERENTIAL/PLATELET
BASO%: 0.3 % (ref 0.0–2.0)
Basophils Absolute: 0 10*3/uL (ref 0.0–0.1)
EOS%: 0.6 % (ref 0.0–7.0)
Eosinophils Absolute: 0.1 10*3/uL (ref 0.0–0.5)
HEMATOCRIT: 33 % — AB (ref 34.8–46.6)
HGB: 11 g/dL — ABNORMAL LOW (ref 11.6–15.9)
LYMPH#: 2.3 10*3/uL (ref 0.9–3.3)
LYMPH%: 23.4 % (ref 14.0–49.7)
MCH: 30.6 pg (ref 25.1–34.0)
MCHC: 33.3 g/dL (ref 31.5–36.0)
MCV: 91.7 fL (ref 79.5–101.0)
MONO#: 0.7 10*3/uL (ref 0.1–0.9)
MONO%: 7.2 % (ref 0.0–14.0)
NEUT#: 6.8 10*3/uL — ABNORMAL HIGH (ref 1.5–6.5)
NEUT%: 68.5 % (ref 38.4–76.8)
Platelets: 240 10*3/uL (ref 145–400)
RBC: 3.59 10*6/uL — ABNORMAL LOW (ref 3.70–5.45)
RDW: 13.5 % (ref 11.2–14.5)
WBC: 9.9 10*3/uL (ref 3.9–10.3)

## 2014-04-13 LAB — BASIC METABOLIC PANEL (CC13)
Anion Gap: 8 mEq/L (ref 3–11)
BUN: 9.8 mg/dL (ref 7.0–26.0)
CHLORIDE: 108 meq/L (ref 98–109)
CO2: 22 meq/L (ref 22–29)
CREATININE: 0.6 mg/dL (ref 0.6–1.1)
Calcium: 9.2 mg/dL (ref 8.4–10.4)
Glucose: 91 mg/dl (ref 70–140)
POTASSIUM: 3.7 meq/L (ref 3.5–5.1)
Sodium: 138 mEq/L (ref 136–145)

## 2014-04-13 NOTE — Telephone Encounter (Signed)
Left VM for pt informing her ok per Dr. Bertis RuddyGorsuch ok for her to travel while on the lovenox and to call [place] back if any further questions.

## 2014-04-13 NOTE — Telephone Encounter (Signed)
gve the pt her aug 2015 appt calendar. °

## 2014-04-13 NOTE — Progress Notes (Signed)
[facility] OFFICE PROGRESS NOTE  No PCP Per Patient DIAGNOSIS:  History of recurrent miscarriages, factor V Leiden mutation, currently pregnant  SUMMARY OF HEMATOLOGIC HISTORY: This is a patient who was seen before for history of recurrent miscarriages. The patient have 1 normal pregnancy. Subsequently, she had 2 miscarriages, first one in the first trimester and second one around 10 weeks. Both gestations showed normal cytogenetics. Thrombophilia workup showed she was positive for factor V Leiden mutation, positive for MTHFR mutation and low protein C level. She is started on Lovenox 40 mg subcutaneous daily. INTERVAL HISTORY: Brandy Kaiser 33 y.o. female returns for further followup.  She is currently [redacted] weeks pregnant. She has some mild bruises on the injection sites. The patient denies any recent signs or symptoms of bleeding such as spontaneous epistaxis, hematuria or hematochezia.   I have reviewed the past medical history, past surgical history, social history and family history with the patient and they are unchanged from previous note.  ALLERGIES:  has No Known Allergies.  MEDICATIONS:  Current Outpatient Prescriptions  Medication Sig Dispense Refill  . enoxaparin (LOVENOX) 40 MG/0.4ML injection Inject 0.4 mLs (40 mg total) into the skin daily.  30 Syringe  8  . folic acid (FOLVITE) 1 MG tablet Take 4 mg by mouth daily.       . Prenatal Vit-Fe Fumarate-FA (PRENATAL MULTIVITAMIN) TABS tablet Take 1 tablet by mouth daily at 12 noon.      . Probiotic Product (PROBIOTIC DAILY PO) Take 1 capsule by mouth daily.      . Pyridoxine HCl (VITAMIN B-6 PO) Take 1 tablet by mouth 3 (three) times daily.      . ondansetron (ZOFRAN-ODT) 4 MG disintegrating tablet        No current facility-administered medications for this visit.     REVIEW OF SYSTEMS:   All other systems were reviewed with the patient and are negative.  PHYSICAL EXAMINATION: ECOG PERFORMANCE STATUS: 0  - Asymptomatic  Filed Vitals:   04/13/14 0917  BP: 99/56  Pulse: 84  Temp: 98.2 F (36.8 C)  Resp: 18   Filed Weights   04/13/14 0917  Weight: 116 lb 6.4 oz (52.799 kg)    GENERAL:alert, no distress and comfortable SKIN: skin color, texture, turgor are normal, no rashes or significant lesions. Mild bruises. EYES: normal, Conjunctiva are pink and non-injected, sclera clear OROPHARYNX:no exudate, no erythema and lips, buccal mucosa, and tongue normal  NECK: supple, thyroid normal size, non-tender, without nodularity LYMPH:  no palpable lymphadenopathy in the cervical, axillary or inguinal LUNGS: clear to auscultation and percussion with normal breathing effort HEART: regular rate & rhythm and no murmurs and no lower extremity edema ABDOMEN:abdomen soft, non-tender and normal bowel sounds Musculoskeletal:no cyanosis of digits and no clubbing  NEURO: alert & oriented x 3 with fluent speech, no focal motor/sensory deficits  LABORATORY DATA:  I have reviewed the data as listed Results for orders placed in visit on 04/13/14 (from the past 48 hour(s))  CBC WITH DIFFERENTIAL     Status: Abnormal   Collection Time    04/13/14  9:01 AM      Result Value Ref Range   WBC 9.9  3.9 - 10.3 10e3/uL   NEUT# 6.8 (*) 1.5 - 6.5 10e3/uL   HGB 11.0 (*) 11.6 - 15.9 g/dL   HCT 16.133.0 (*) 09.634.8 - 04.546.6 %   Platelets 240  145 - 400 10e3/uL   MCV 91.7  79.5 - 101.0 fL  MCH 30.6  25.1 - 34.0 pg   MCHC 33.3  31.5 - 36.0 g/dL   RBC 5.783.59 (*) 4.693.70 - 6.295.45 10e6/uL   RDW 13.5  11.2 - 14.5 %   lymph# 2.3  0.9 - 3.3 10e3/uL   MONO# 0.7  0.1 - 0.9 10e3/uL   Eosinophils Absolute 0.1  0.0 - 0.5 10e3/uL   Basophils Absolute 0.0  0.0 - 0.1 10e3/uL   NEUT% 68.5  38.4 - 76.8 %   LYMPH% 23.4  14.0 - 49.7 %   MONO% 7.2  0.0 - 14.0 %   EOS% 0.6  0.0 - 7.0 %   BASO% 0.3  0.0 - 2.0 %  BASIC METABOLIC PANEL (CC13)     Status: None   Collection Time    04/13/14  9:01 AM      Result Value Ref Range   Sodium 138   136 - 145 mEq/L   Potassium 3.7  3.5 - 5.1 mEq/L   Chloride 108  98 - 109 mEq/L   CO2 22  22 - 29 mEq/L   Glucose 91  70 - 140 mg/dl   BUN 9.8  7.0 - 52.826.0 mg/dL   Creatinine 0.6  0.6 - 1.1 mg/dL   Calcium 9.2  8.4 - 41.310.4 mg/dL   Anion Gap 8  3 - 11 mEq/L    Lab Results  Component Value Date   WBC 9.9 04/13/2014   HGB 11.0* 04/13/2014   HCT 33.0* 04/13/2014   MCV 91.7 04/13/2014   PLT 240 04/13/2014  ASSESSMENT & PLAN:  #1 history of recurrent miscarriages #2 positive factor V Leiden mutation, heterozygous #3 positive for MT HFR mutation, heterozygous #4 low protein C level #5 new pregnancy at second trimester I discussed with the patient and the use of prophylactic low molecular weight heparin to prevent recurrent miscarriages. The risk and benefit of Lovenox as discussed with the patient and she agreed to proceed. She tolerated the injection well apart from mild bruises which is expected. I plan to see her back in 3 months with repeat blood work, history and physical examination. My plan would be for her to continue on Lovenox until 24-48 hours prior to planned delivery, and to continue postpartum for 4-6 weeks after delivery due to high-risk of postpartum DVT. #6 mild anemia This is related to pregnancy. She is not symptomatic. Continue prenatal vitamins.  All questions were answered. The patient knows to call the clinic with any problems, questions or concerns. No barriers to learning was detected.  I spent 15 minutes counseling the patient face to face. The total time spent in the appointment was 20 minutes and more than 50% was on counseling.     Artis DelayNi Heber Hoog, MD 04/13/2014 4:52 PM

## 2014-04-13 NOTE — Telephone Encounter (Signed)
Yes, OK 

## 2014-04-13 NOTE — Telephone Encounter (Signed)
Pt forgot to ask Dr. Bertis RuddyGorsuch today if it's ok for her to go [place] vacation to [place] in [place]?  She says it is ok w/ her OB/GYN.  She wants to make sure it is ok to travel while on lovenox?

## 2014-07-22 ENCOUNTER — Telehealth: Payer: Self-pay | Admitting: Hematology and Oncology

## 2014-07-22 ENCOUNTER — Encounter: Payer: Self-pay | Admitting: Hematology and Oncology

## 2014-07-22 ENCOUNTER — Ambulatory Visit (HOSPITAL_BASED_OUTPATIENT_CLINIC_OR_DEPARTMENT_OTHER): Payer: BC Managed Care – PPO | Admitting: Hematology and Oncology

## 2014-07-22 ENCOUNTER — Other Ambulatory Visit (HOSPITAL_BASED_OUTPATIENT_CLINIC_OR_DEPARTMENT_OTHER): Payer: BC Managed Care – PPO

## 2014-07-22 VITALS — BP 113/65 | HR 89 | Temp 98.3°F | Resp 18 | Ht 65.0 in | Wt 139.1 lb

## 2014-07-22 DIAGNOSIS — D72829 Elevated white blood cell count, unspecified: Secondary | ICD-10-CM | POA: Insufficient documentation

## 2014-07-22 DIAGNOSIS — D6859 Other primary thrombophilia: Secondary | ICD-10-CM

## 2014-07-22 DIAGNOSIS — D689 Coagulation defect, unspecified: Secondary | ICD-10-CM

## 2014-07-22 DIAGNOSIS — O99119 Other diseases of the blood and blood-forming organs and certain disorders involving the immune mechanism complicating pregnancy, unspecified trimester: Secondary | ICD-10-CM

## 2014-07-22 DIAGNOSIS — D649 Anemia, unspecified: Secondary | ICD-10-CM

## 2014-07-22 DIAGNOSIS — O99112 Other diseases of the blood and blood-forming organs and certain disorders involving the immune mechanism complicating pregnancy, second trimester: Principal | ICD-10-CM

## 2014-07-22 LAB — CBC WITH DIFFERENTIAL/PLATELET
BASO%: 0.3 % (ref 0.0–2.0)
Basophils Absolute: 0 10*3/uL (ref 0.0–0.1)
EOS%: 0.7 % (ref 0.0–7.0)
Eosinophils Absolute: 0.1 10*3/uL (ref 0.0–0.5)
HEMATOCRIT: 32 % — AB (ref 34.8–46.6)
HGB: 10.4 g/dL — ABNORMAL LOW (ref 11.6–15.9)
LYMPH%: 15.8 % (ref 14.0–49.7)
MCH: 30.6 pg (ref 25.1–34.0)
MCHC: 32.6 g/dL (ref 31.5–36.0)
MCV: 93.7 fL (ref 79.5–101.0)
MONO#: 0.9 10*3/uL (ref 0.1–0.9)
MONO%: 7.4 % (ref 0.0–14.0)
NEUT#: 9.7 10*3/uL — ABNORMAL HIGH (ref 1.5–6.5)
NEUT%: 75.8 % (ref 38.4–76.8)
PLATELETS: 187 10*3/uL (ref 145–400)
RBC: 3.41 10*6/uL — AB (ref 3.70–5.45)
RDW: 13.5 % (ref 11.2–14.5)
WBC: 12.8 10*3/uL — ABNORMAL HIGH (ref 3.9–10.3)
lymph#: 2 10*3/uL (ref 0.9–3.3)

## 2014-07-22 NOTE — Assessment & Plan Note (Signed)
This is likely anemia of chronic disease and hemodilution from pregnancy.. The patient denies recent history of bleeding such as epistaxis, hematuria or hematochezia. She is asymptomatic from the anemia. We will observe for now. She'll continue on oral iron supplement.

## 2014-07-22 NOTE — Telephone Encounter (Signed)
per pof to sch pt appt-gave pt copy of sch °

## 2014-07-22 NOTE — Assessment & Plan Note (Signed)
This is likely related to pregnancy. Observed only.

## 2014-07-22 NOTE — Assessment & Plan Note (Signed)
She is doing well and tolerated Lovenox injection without major complication apart from minor bruising. I plan to continue the same until about 37 weeks pregnancy. At that point in time, we'll plan to switch her to subcutaneous unfractionated heparin.

## 2014-07-22 NOTE — Progress Notes (Signed)
[facility] OFF[facility]ESS [facility]  No PCP Per Patient  SUMMARY OF HEMATOLOGIC HISTORY: This is a patient who was seen before for history of recurrent miscarriages, factor V Leiden mutation, currently pregnant with her second child. Her expected due date is 10/05/2014. The patient have 1 normal pregnancy. Subsequently, she had 2 miscarriages, first one in the first trimester and second one around 10 weeks. Both gestations showed normal cytogenetics. Thrombophilia workup showed she was positive for factor V Leiden mutation, positive for MTHFR mutation and low protein C level. She is started on Lovenox 40 mg subcutaneous daily. INTERVAL HISTORY: Brandy Kaiser 33 y.o. female returns for further followup. She is currently [redacted] weeks pregnant. She started placing injection on her thigh. She has some minor bruises. The patient denies any recent signs or symptoms of bleeding such as spontaneous epistaxis, hematuria or hematochezia.  I have reviewed the past medical history, past surgical history, social history and family history with the patient and they are unchanged from previous note.  ALLERGIES:  has No Known Allergies.  MEDICATIONS:  Current Outpatient Prescriptions  Medication Sig Dispense Refill  . ferrous sulfate 325 (65 FE) MG tablet Take 325 mg by mouth 2 (two) times daily with a meal.      . enoxaparin (LOVENOX) 40 MG/0.4ML injection Inject 0.4 mLs (40 mg total) into the skin daily.  30 Syringe  8  . folic acid (FOLVITE) 1 MG tablet Take 4 mg by mouth daily.       . Prenatal Vit-Fe Fumarate-FA (PRENATAL MULTIVITAMIN) TABS tablet Take 1 tablet by mouth daily at 12 noon.      . Probiotic Product (PROBIOTIC DAILY PO) Take 1 capsule by mouth daily.      . Pyridoxine HCl (VITAMIN B-6 PO) Take 1 tablet by mouth 3 (three) times daily.       No current facility-administered medications for this visit.     REVIEW OF SYSTEMS:   Constitutional: Denies fevers, chills or night  sweats Eyes: Denies blurriness of vision Ears, nose, mouth, throat, and face: Denies mucositis or sore throat Respiratory: Denies cough, dyspnea or wheezes Cardiovascular: Denies palpitation, chest discomfort or lower extremity swelling Gastrointestinal:  Denies nausea, heartburn or change in bowel habits Skin: Denies abnormal skin rashes Lymphatics: Denies new lymphadenopathy Neurological:Denies numbness, tingling or new weaknesses Behavioral/Psych: Mood is stable, no new changes  All other systems were reviewed with the patient and are negative.  PHYSICAL EXAMINATION: ECOG PERFORMANCE STATUS: 0 - Asymptomatic  Filed Vitals:   07/22/14 0926  BP: 113/65  Pulse: 89  Temp: 98.3 F (36.8 C)  Resp: 18   Filed Weights   07/22/14 0926  Weight: 139 lb 1.6 oz (63.095 kg)    GENERAL:alert, no distress and comfortable SKIN: skin color, texture, turgor are normal, no rashes or significant lesions. Minor bruises are seen on the thigh EYES: normal, Conjunctiva are pink and non-injected, sclera clear OROPHARYNX:no exudate, no erythema and lips, buccal mucosa, and tongue normal  Musculoskeletal:no cyanosis of digits and no clubbing  NEURO: alert & oriented x 3 with fluent speech, no focal motor/sensory deficits  LABORATORY DATA:  I have reviewed the data as listed Results for orders placed in visit on 07/22/14 (from the past 48 hour(s))  CBC WITH DIFFERENTIAL     Status: Abnormal   Collection Time    07/22/14  9:17 AM      Result Value Ref Range   WBC 12.8 (*) 3.9 - 10.3 10e3/uL  NEUT# 9.7 (*) 1.5 - 6.5 10e3/uL   HGB 10.4 (*) 11.6 - 15.9 g/dL   HCT 45.4 (*) 09.8 - 11.9 %   Platelets 187  145 - 400 10e3/uL   MCV 93.7  79.5 - 101.0 fL   MCH 30.6  25.1 - 34.0 pg   MCHC 32.6  31.5 - 36.0 g/dL   RBC 1.47 (*) 8.29 - 5.62 10e6/uL   RDW 13.5  11.2 - 14.5 %   lymph# 2.0  0.9 - 3.3 10e3/uL   MONO# 0.9  0.1 - 0.9 10e3/uL   Eosinophils Absolute 0.1  0.0 - 0.5 10e3/uL   Basophils Absolute  0.0  0.0 - 0.1 10e3/uL   NEUT% 75.8  38.4 - 76.8 %   LYMPH% 15.8  14.0 - 49.7 %   MONO% 7.4  0.0 - 14.0 %   EOS% 0.7  0.0 - 7.0 %   BASO% 0.3  0.0 - 2.0 %    Lab Results  Component Value Date   WBC 12.8* 07/22/2014   HGB 10.4* 07/22/2014   HCT 32.0* 07/22/2014   MCV 93.7 07/22/2014   PLT 187 07/22/2014   ASSESSMENT & PLAN:  Thrombophilia complicating pregnancy, antepartum She is doing well and tolerated Lovenox injection without major complication apart from minor bruising. I plan to continue the same until about 37 weeks pregnancy. At that point in time, we'll plan to switch her to subcutaneous unfractionated heparin.  Anemia, unspecified This is likely anemia of chronic disease and hemodilution from pregnancy.. The patient denies recent history of bleeding such as epistaxis, hematuria or hematochezia. She is asymptomatic from the anemia. We will observe for now. She'll continue on oral iron supplement.  Leukocytosis This is likely related to pregnancy. Observed only.     All questions were answered. The patient knows to call the clinic with any problems, questions or concerns. No barriers to learning was detected.  I spent 15 minutes counseling the patient face to face. The total time spent in the appointment was 20 minutes and more than 50% was on counseling.     Sayla Golonka, MD 07/22/2014 10:46 AM

## 2014-09-10 ENCOUNTER — Ambulatory Visit: Payer: BC Managed Care – PPO | Admitting: Hematology and Oncology

## 2014-09-10 ENCOUNTER — Other Ambulatory Visit: Payer: BC Managed Care – PPO

## 2014-09-10 LAB — OB RESULTS CONSOLE GBS: STREP GROUP B AG: NEGATIVE

## 2014-09-14 ENCOUNTER — Telehealth: Payer: Self-pay | Admitting: *Deleted

## 2014-09-14 NOTE — Telephone Encounter (Signed)
Pt says Dr. Bertis RuddyGorsuch is suppose to change her from Lovenox to Heparin on visit later this week.  She asks if she should still take lovenox that morning or wait for appt.. Since nurse will need to teach her how to draw up the heparin.  She was told it doesn't come prefilled and she will need to learn how to draw up syringes.  Informed pt either desk nurse or injection nurse can teach her how to draw up heparin.  But we need to get the dose from Dr. Bertis RuddyGorsuch first.  Teaching will be provided after her office visit and Dr. Bertis RuddyGorsuch can give us the dose of heparin.   Instructed pt to take her lovenox that morning as scheduled.  She verbalized understanding.

## 2014-09-17 ENCOUNTER — Other Ambulatory Visit (HOSPITAL_BASED_OUTPATIENT_CLINIC_OR_DEPARTMENT_OTHER): Payer: BC Managed Care – PPO

## 2014-09-17 ENCOUNTER — Ambulatory Visit (HOSPITAL_BASED_OUTPATIENT_CLINIC_OR_DEPARTMENT_OTHER): Payer: BC Managed Care – PPO | Admitting: Hematology and Oncology

## 2014-09-17 ENCOUNTER — Telehealth: Payer: Self-pay | Admitting: Hematology and Oncology

## 2014-09-17 ENCOUNTER — Encounter: Payer: Self-pay | Admitting: Hematology and Oncology

## 2014-09-17 ENCOUNTER — Ambulatory Visit: Payer: BC Managed Care – PPO

## 2014-09-17 VITALS — BP 104/65 | HR 85 | Temp 97.9°F | Resp 18 | Ht 65.0 in | Wt 147.8 lb

## 2014-09-17 DIAGNOSIS — O99112 Other diseases of the blood and blood-forming organs and certain disorders involving the immune mechanism complicating pregnancy, second trimester: Secondary | ICD-10-CM

## 2014-09-17 DIAGNOSIS — D649 Anemia, unspecified: Secondary | ICD-10-CM

## 2014-09-17 DIAGNOSIS — D6859 Other primary thrombophilia: Secondary | ICD-10-CM

## 2014-09-17 DIAGNOSIS — O99113 Other diseases of the blood and blood-forming organs and certain disorders involving the immune mechanism complicating pregnancy, third trimester: Secondary | ICD-10-CM

## 2014-09-17 LAB — CBC WITH DIFFERENTIAL/PLATELET
BASO%: 0.2 % (ref 0.0–2.0)
Basophils Absolute: 0 10*3/uL (ref 0.0–0.1)
EOS%: 0.5 % (ref 0.0–7.0)
Eosinophils Absolute: 0.1 10*3/uL (ref 0.0–0.5)
HEMATOCRIT: 33.1 % — AB (ref 34.8–46.6)
HGB: 10.9 g/dL — ABNORMAL LOW (ref 11.6–15.9)
LYMPH%: 18 % (ref 14.0–49.7)
MCH: 30.8 pg (ref 25.1–34.0)
MCHC: 33 g/dL (ref 31.5–36.0)
MCV: 93.3 fL (ref 79.5–101.0)
MONO#: 0.8 10*3/uL (ref 0.1–0.9)
MONO%: 7.7 % (ref 0.0–14.0)
NEUT#: 8.1 10*3/uL — ABNORMAL HIGH (ref 1.5–6.5)
NEUT%: 73.6 % (ref 38.4–76.8)
Platelets: 190 10*3/uL (ref 145–400)
RBC: 3.55 10*6/uL — AB (ref 3.70–5.45)
RDW: 13.7 % (ref 11.2–14.5)
WBC: 11.1 10*3/uL — AB (ref 3.9–10.3)
lymph#: 2 10*3/uL (ref 0.9–3.3)

## 2014-09-17 LAB — BASIC METABOLIC PANEL (CC13)
Anion Gap: 7 mEq/L (ref 3–11)
BUN: 10.4 mg/dL (ref 7.0–26.0)
CO2: 23 meq/L (ref 22–29)
CREATININE: 0.6 mg/dL (ref 0.6–1.1)
Calcium: 8.9 mg/dL (ref 8.4–10.4)
Chloride: 108 mEq/L (ref 98–109)
GLUCOSE: 111 mg/dL (ref 70–140)
POTASSIUM: 3.7 meq/L (ref 3.5–5.1)
Sodium: 138 mEq/L (ref 136–145)

## 2014-09-17 LAB — TECHNOLOGIST REVIEW

## 2014-09-17 MED ORDER — HEPARIN SODIUM (PORCINE) 5000 UNIT/ML IJ SOLN: 5000.0000 [IU] | Freq: Two times a day (BID) | INTRAMUSCULAR | Status: DC

## 2014-09-17 NOTE — Telephone Encounter (Signed)
gv and printed appt sched and avs for pt fro DEC

## 2014-09-17 NOTE — Progress Notes (Signed)
Brandy Kaiser here for instructions about drawing up Heparin.  She will be getting 0.915ml as ordered per Dr Bertis RuddyGorsuch.  Patient and husband state understanding per teach back method.

## 2014-09-17 NOTE — Assessment & Plan Note (Signed)
She is doing well and tolerated Lovenox injection without major complication apart from minor bruising. I plan to switch her to subcutaneous unfractionated heparin. We discussed the rationale of switching treatment. I have scheduled her an appointment with the injection nurse to learn the techniques of drawing heparin from a vial. She will start Heparin 5000 units twice a day subcutaneous injection until delivery and then resume Lovenox injection after delivery for up to 6 weeks. I discussed with her various scenarios and summarized my recommendations as follows: Before delivery: 1) If the patient felt that she may go into labor, she would discontinue injection immediately and inform her obstetrician. 2) If she has planned induction labor, she will stop taking heparin injection the same morning of her planned labor.  After delivery: 3) If her baby is delivered via vaginal route before 12 pm, she can restart Lovenox injection the same evening of delivery 4) If her baby is delivered via vaginal route after 12 pm, she can restart Lovenox injection and the next day after delivery 5) If the baby is delivered via C-section before 12 pm, she can restart Lovenox injection the same evening of delivery or the next day after pending review from obstetrician balancing the risk of bleeding 6) If her baby is delivered via C-section after 12 pm, she can restart Lovenox injection the next day after delivery  I plan to see her back within 6 weeks after her anticipated delivery to recheck her blood and we'll plan on discontinuation of Lovenox injection then.

## 2014-09-17 NOTE — Assessment & Plan Note (Signed)
This is likely anemia of chronic disease and hemodilution from pregnancy.. The patient denies recent history of bleeding such as epistaxis, hematuria or hematochezia. She is asymptomatic from the anemia. We will observe for now. She'll continue on oral iron supplement.

## 2014-09-17 NOTE — Progress Notes (Signed)
[facility] OFFICE PROGRESS NOTE  No PCP Per Patient SUMMARY OF HEMATOLOGIC HISTORY: This is a patient who was seen before for history of recurrent miscarriages, factor V Leiden mutation, currently pregnant with her second child. Her expected due date is 10/05/2014. The patient have 1 normal pregnancy. Subsequently, she had 2 miscarriages, first one in the first trimester and second one around 10 weeks. Both gestations showed normal cytogenetics. Thrombophilia workup showed she was positive for factor V Leiden mutation, positive for MTHFR mutation and low protein C level. She is started on Lovenox 40 mg subcutaneous daily. 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 She is doing well with Lovenox without excessive bruising.  I have reviewed the past medical history, past surgical history, social history and family history with the patient and they are unchanged from previous note.  ALLERGIES:  has No Known Allergies.  MEDICATIONS:  Current Outpatient Prescriptions  Medication Sig Dispense Refill  . ferrous sulfate 325 (65 FE) MG tablet Take 325 mg by mouth 2 (two) times daily with a meal.      . folic acid (FOLVITE) 1 MG tablet Take 4 mg by mouth daily.       . Prenatal Vit-Fe Fumarate-FA (PRENATAL MULTIVITAMIN) TABS tablet Take 1 tablet by mouth daily at 12 noon.      . Probiotic Product (PROBIOTIC DAILY PO) Take 1 capsule by mouth daily.      . Pyridoxine HCl (VITAMIN B-6 PO) Take 1 tablet by mouth 3 (three) times daily.      . heparin 5000 UNIT/ML injection Inject 1 mL (5,000 Units total) into the skin 2 (two) times daily.  10 mL  5   No current facility-administered medications for this visit.     REVIEW OF SYSTEMS:   Constitutional: Denies fevers, chills or night sweats Eyes: Denies blurriness of vision Ears, nose, mouth, throat, and  face: Denies mucositis or sore throat Respiratory: Denies cough, dyspnea or wheezes Cardiovascular: Denies palpitation, chest discomfort or lower extremity swelling Gastrointestinal:  Denies nausea, heartburn or change in bowel habits Skin: Denies abnormal skin rashes Lymphatics: Denies new lymphadenopathy or easy bruising Neurological:Denies numbness, tingling or new weaknesses Behavioral/Psych: Mood is stable, no new changes  All other systems were reviewed with the patient and are negative.  PHYSICAL EXAMINATION: ECOG PERFORMANCE STATUS: 0 - Asymptomatic  Filed Vitals:   09/17/14 0945  BP: 104/65  Pulse: 85  Temp: 97.9 F (36.6 C)  Resp: 18   Filed Weights   09/17/14 0945  Weight: 147 lb 12.8 oz (67.042 kg)    GENERAL:alert, no distress and comfortable SKIN: skin color, texture, turgor are normal, no rashes or significant lesions EYES: normal, Conjunctiva are pink and non-injected, sclera clear Musculoskeletal:no cyanosis of digits and no clubbing  NEURO: alert & oriented x 3 with fluent speech, no focal motor/sensory deficits  LABORATORY DATA:  I have reviewed the data as listed Results for orders placed in visit on 09/17/14 (from the past 48 hour(s))  CBC WITH DIFFERENTIAL     Status: Abnormal   Collection Time    09/17/14  9:33 AM      Result Value Ref Range   WBC 11.1 (*) 3.9 - 10.3 10e3/uL   NEUT# 8.1 (*) 1.5 - 6.5 10e3/uL   HGB 10.9 (*) 11.6 - 15.9 g/dL   HCT 16.133.1 (*) 09.634.8 - 04.546.6 %  Platelets 190  145 - 400 10e3/uL   MCV 93.3  79.5 - 101.0 fL   MCH 30.8  25.1 - 34.0 pg   MCHC 33.0  31.5 - 36.0 g/dL   RBC 4.09 (*) 8.11 - 9.14 10e6/uL   RDW 13.7  11.2 - 14.5 %   lymph# 2.0  0.9 - 3.3 10e3/uL   MONO# 0.8  0.1 - 0.9 10e3/uL   Eosinophils Absolute 0.1  0.0 - 0.5 10e3/uL   Basophils Absolute 0.0  0.0 - 0.1 10e3/uL   NEUT% 73.6  38.4 - 76.8 %   LYMPH% 18.0  14.0 - 49.7 %   MONO% 7.7  0.0 - 14.0 %   EOS% 0.5  0.0 - 7.0 %   BASO% 0.2  0.0 - 2.0 %   TECHNOLOGIST REVIEW     Status: None   Collection Time    09/17/14  9:33 AM      Result Value Ref Range   Technologist Review Oc variant lymph    BASIC METABOLIC PANEL (CC13)     Status: None   Collection Time    09/17/14  9:33 AM      Result Value Ref Range   Sodium 138  136 - 145 mEq/L   Potassium 3.7  3.5 - 5.1 mEq/L   Chloride 108  98 - 109 mEq/L   CO2 23  22 - 29 mEq/L   Glucose 111  70 - 140 mg/dl   BUN 78.2  7.0 - 95.6 mg/dL   Creatinine 0.6  0.6 - 1.1 mg/dL   Calcium 8.9  8.4 - 21.3 mg/dL   Anion Gap 7  3 - 11 mEq/L    Lab Results  Component Value Date   WBC 11.1* 09/17/2014   HGB 10.9* 09/17/2014   HCT 33.1* 09/17/2014   MCV 93.3 09/17/2014   PLT 190 09/17/2014    ASSESSMENT & PLAN:  Thrombophilia complicating pregnancy, antepartum She is doing well and tolerated Lovenox injection without major complication apart from minor bruising. I plan to switch her to subcutaneous unfractionated heparin. We discussed the rationale of switching treatment. I have scheduled her an appointment with the injection nurse to learn the techniques of drawing heparin from a vial. She will start Heparin 5000 units twice a day subcutaneous injection until delivery and then resume Lovenox injection after delivery for up to 6 weeks. I discussed with her various scenarios and summarized my recommendations as follows: Before delivery: 1) If the patient felt that she may go into labor, she would discontinue injection immediately and inform her obstetrician. 2) If she has planned induction labor, she will stop taking heparin injection the same morning of her planned labor.  After delivery: 3) If her baby is delivered via vaginal route before 12 pm, she can restart Lovenox injection the same evening of delivery 4) If her baby is delivered via vaginal route after 12 pm, she can restart Lovenox injection and the next day after delivery 5) If the baby is delivered via C-section before 12 pm, she can  restart Lovenox injection the same evening of delivery or the next day after pending review from obstetrician balancing the risk of bleeding 6) If her baby is delivered via C-section after 12 pm, she can restart Lovenox injection the next day after delivery  I plan to see her back within 6 weeks after her anticipated delivery to recheck her blood and we'll plan on discontinuation of Lovenox injection then.  Anemia complicating pregnancy in third  trimester This is likely anemia of chronic disease and hemodilution from pregnancy.. The patient denies recent history of bleeding such as epistaxis, hematuria or hematochezia. She is asymptomatic from the anemia. We will observe for now. She'll continue on oral iron supplement.      All questions were answered. The patient knows to call the clinic with any problems, questions or concerns. No barriers to learning was detected.  I spent 30 minutes counseling the patient face to face. The total time spent in the appointment was 40 minutes and more than 50% was on counseling.     [facility], Hart Haas, MD 09/17/2014 9:24 PM

## 2014-10-07 ENCOUNTER — Encounter (HOSPITAL_COMMUNITY): Payer: Self-pay | Admitting: *Deleted

## 2014-10-07 ENCOUNTER — Telehealth (HOSPITAL_COMMUNITY): Payer: Self-pay | Admitting: *Deleted

## 2014-10-07 ENCOUNTER — Other Ambulatory Visit: Payer: Self-pay | Admitting: Obstetrics and Gynecology

## 2014-10-07 NOTE — Telephone Encounter (Signed)
Preadmission screen  

## 2014-10-09 ENCOUNTER — Encounter (HOSPITAL_COMMUNITY): Payer: BC Managed Care – PPO | Admitting: Anesthesiology

## 2014-10-09 ENCOUNTER — Inpatient Hospital Stay (HOSPITAL_COMMUNITY)
Admission: AD | Admit: 2014-10-09 | Discharge: 2014-10-11 | DRG: 775 | Disposition: A | Discharge status: Home / Self Care | Payer: BC Managed Care – PPO | Source: Ambulatory Visit | Attending: Obstetrics and Gynecology | Admitting: Obstetrics and Gynecology

## 2014-10-09 ENCOUNTER — Encounter (HOSPITAL_COMMUNITY): Payer: Self-pay | Admitting: *Deleted

## 2014-10-09 ENCOUNTER — Inpatient Hospital Stay (HOSPITAL_COMMUNITY): Payer: BC Managed Care – PPO | Admitting: Anesthesiology

## 2014-10-09 ENCOUNTER — Telehealth: Payer: Self-pay | Admitting: *Deleted

## 2014-10-09 DIAGNOSIS — O99284 Endocrine, nutritional and metabolic diseases complicating childbirth: Secondary | ICD-10-CM | POA: Diagnosis present

## 2014-10-09 DIAGNOSIS — O9989 Other specified diseases and conditions complicating pregnancy, childbirth and the puerperium: Secondary | ICD-10-CM

## 2014-10-09 DIAGNOSIS — Z3A39 39 weeks gestation of pregnancy: Secondary | ICD-10-CM | POA: Diagnosis present

## 2014-10-09 DIAGNOSIS — Z23 Encounter for immunization: Secondary | ICD-10-CM | POA: Diagnosis not present

## 2014-10-09 DIAGNOSIS — O99892 Other specified diseases and conditions complicating childbirth: Secondary | ICD-10-CM

## 2014-10-09 DIAGNOSIS — IMO0001 Reserved for inherently not codable concepts without codable children: Secondary | ICD-10-CM

## 2014-10-09 DIAGNOSIS — Z1589 Genetic susceptibility to other disease: Secondary | ICD-10-CM | POA: Diagnosis present

## 2014-10-09 DIAGNOSIS — O9912 Other diseases of the blood and blood-forming organs and certain disorders involving the immune mechanism complicating childbirth: Principal | ICD-10-CM | POA: Diagnosis present

## 2014-10-09 DIAGNOSIS — N858 Other specified noninflammatory disorders of uterus: Secondary | ICD-10-CM | POA: Diagnosis present

## 2014-10-09 DIAGNOSIS — O99113 Other diseases of the blood and blood-forming organs and certain disorders involving the immune mechanism complicating pregnancy, third trimester: Secondary | ICD-10-CM | POA: Diagnosis present

## 2014-10-09 DIAGNOSIS — O3421 Maternal care for scar from previous cesarean delivery: Secondary | ICD-10-CM | POA: Diagnosis present

## 2014-10-09 DIAGNOSIS — D6851 Activated protein C resistance: Secondary | ICD-10-CM | POA: Diagnosis present

## 2014-10-09 DIAGNOSIS — E7212 Methylenetetrahydrofolate reductase deficiency: Secondary | ICD-10-CM | POA: Diagnosis present

## 2014-10-09 DIAGNOSIS — D62 Acute posthemorrhagic anemia: Secondary | ICD-10-CM | POA: Diagnosis not present

## 2014-10-09 DIAGNOSIS — Z283 Underimmunization status: Secondary | ICD-10-CM

## 2014-10-09 DIAGNOSIS — E7211 Homocystinuria: Secondary | ICD-10-CM | POA: Diagnosis present

## 2014-10-09 LAB — CBC
HEMATOCRIT: 36 % (ref 36.0–46.0)
Hemoglobin: 12.1 g/dL (ref 12.0–15.0)
MCH: 31.4 pg (ref 26.0–34.0)
MCHC: 33.6 g/dL (ref 30.0–36.0)
MCV: 93.5 fL (ref 78.0–100.0)
PLATELETS: 178 10*3/uL (ref 150–400)
RBC: 3.85 MIL/uL — ABNORMAL LOW (ref 3.87–5.11)
RDW: 13.8 % (ref 11.5–15.5)
WBC: 15.3 10*3/uL — ABNORMAL HIGH (ref 4.0–10.5)

## 2014-10-09 MED ORDER — ONDANSETRON HCL 4 MG/2ML IJ SOLN
4.0000 mg | Freq: Four times a day (QID) | INTRAMUSCULAR | Status: DC | PRN
Start: 2014-10-09 — End: 2014-10-10

## 2014-10-09 MED ORDER — ACETAMINOPHEN 325 MG PO TABS: 650.0000 mg | ORAL_TABLET | ORAL | Status: DC | PRN

## 2014-10-09 MED ORDER — OXYTOCIN 40 UNITS IN LACTATED RINGERS INFUSION - SIMPLE MED
1.0000 m[IU]/min | INTRAVENOUS | Status: DC
  Administered 2014-10-09: 2 m[IU]/min via INTRAVENOUS
  Filled 2014-10-09: qty 1000

## 2014-10-09 MED ORDER — FENTANYL 2.5 MCG/ML BUPIVACAINE 1/10 % EPIDURAL INFUSION (WH - ANES)
14.0000 mL/h | INTRAMUSCULAR | Status: DC | PRN
  Administered 2014-10-09: 14 mL/h via EPIDURAL
  Filled 2014-10-09: qty 125

## 2014-10-09 MED ORDER — LACTATED RINGERS IV SOLN: 500.0000 mL | Freq: Once | INTRAVENOUS | Status: DC

## 2014-10-09 MED ORDER — TERBUTALINE SULFATE 1 MG/ML IJ SOLN: 0.2500 mg | Freq: Once | INTRAMUSCULAR | Status: AC | PRN

## 2014-10-09 MED ORDER — FLEET ENEMA 7-19 GM/118ML RE ENEM: 1.0000 | ENEMA | RECTAL | Status: DC | PRN

## 2014-10-09 MED ORDER — OXYTOCIN BOLUS FROM INFUSION: 500.0000 mL | INTRAVENOUS | Status: DC

## 2014-10-09 MED ORDER — DIPHENHYDRAMINE HCL 50 MG/ML IJ SOLN: 12.5000 mg | INTRAMUSCULAR | Status: DC | PRN

## 2014-10-09 MED ORDER — LACTATED RINGERS IV SOLN: 500.0000 mL | INTRAVENOUS | Status: DC | PRN

## 2014-10-09 MED ORDER — EPHEDRINE 5 MG/ML INJ
10.0000 mg | INTRAVENOUS | Status: DC | PRN
  Filled 2014-10-09: qty 2

## 2014-10-09 MED ORDER — OXYCODONE-ACETAMINOPHEN 5-325 MG PO TABS: 1.0000 | ORAL_TABLET | ORAL | Status: DC | PRN

## 2014-10-09 MED ORDER — PHENYLEPHRINE 40 MCG/ML (10ML) SYRINGE FOR IV PUSH (FOR BLOOD PRESSURE SUPPORT)
80.0000 ug | PREFILLED_SYRINGE | INTRAVENOUS | Status: AC | PRN
  Administered 2014-10-09 (×3): 80 ug via INTRAVENOUS

## 2014-10-09 MED ORDER — LIDOCAINE HCL (PF) 1 % IJ SOLN
INTRAMUSCULAR | Status: DC | PRN
  Administered 2014-10-09 (×2): 6 mL

## 2014-10-09 MED ORDER — LACTATED RINGERS IV SOLN
INTRAVENOUS | Status: DC
  Administered 2014-10-09: 19:00:00 via INTRAVENOUS

## 2014-10-09 MED ORDER — OXYTOCIN 40 UNITS IN LACTATED RINGERS INFUSION - SIMPLE MED: 1.0000 m[IU]/min | INTRAVENOUS | Status: DC

## 2014-10-09 MED ORDER — PHENYLEPHRINE 40 MCG/ML (10ML) SYRINGE FOR IV PUSH (FOR BLOOD PRESSURE SUPPORT)
80.0000 ug | PREFILLED_SYRINGE | INTRAVENOUS | Status: AC | PRN
  Administered 2014-10-09 – 2014-10-10 (×3): 80 ug via INTRAVENOUS
  Filled 2014-10-09 (×2): qty 10

## 2014-10-09 MED ORDER — LACTATED RINGERS IV SOLN
INTRAVENOUS | Status: DC
  Administered 2014-10-10: 03:00:00 via INTRAUTERINE

## 2014-10-09 MED ORDER — OXYTOCIN 40 UNITS IN LACTATED RINGERS INFUSION - SIMPLE MED: 62.5000 mL/h | INTRAVENOUS | Status: DC

## 2014-10-09 MED ORDER — OXYCODONE-ACETAMINOPHEN 5-325 MG PO TABS: 2.0000 | ORAL_TABLET | ORAL | Status: DC | PRN

## 2014-10-09 MED ORDER — CITRIC ACID-SODIUM CITRATE 334-500 MG/5ML PO SOLN
30.0000 mL | ORAL | Status: DC | PRN
  Administered 2014-10-10: 30 mL via ORAL
  Filled 2014-10-09: qty 15

## 2014-10-09 MED ORDER — LIDOCAINE HCL (PF) 1 % IJ SOLN
30.0000 mL | INTRAMUSCULAR | Status: DC | PRN
  Filled 2014-10-09: qty 30

## 2014-10-09 MED ORDER — FENTANYL 2.5 MCG/ML BUPIVACAINE 1/10 % EPIDURAL INFUSION (WH - ANES)
INTRAMUSCULAR | Status: DC | PRN
  Administered 2014-10-09: 14 mL/h via EPIDURAL

## 2014-10-09 NOTE — H&P (Signed)
Brandy Kaiser is a 33 y.o. female presenting @ 39 4/[redacted] weeks gestation in active labor. Intact membrane. GBS cx neg. Factor V leiden deficiency on heparin since 35-36 wk, prior lovenox use. Prev LTCS for breech desires VBAC. Recurrent preg loss Last heparin dose @ 12;30 pm Maternal Medical History:  Reason for admission: Contractions.   Contractions: Onset was 6-12 hours ago.   Frequency: irregular.   Perceived severity is moderate.    Fetal activity: Perceived fetal activity is normal.    Prenatal complications: Transient fetal arrhythmia. Nl fetal echo    OB History   Grav Para Term Preterm Abortions TAB SAB Ect Mult Living   4 1 1  2  2   1      Past Medical History  Diagnosis Date  . PONV (postoperative nausea and vomiting)   . Miscarriage   . MTHFR (methylene THF reductase) deficiency and homocystinuria   . Headache    Past Surgical History  Procedure Laterality Date  . Cesarean section      2011  . Wisdom tooth extraction      july 2012  . Dilation and evacuation N/A 02/01/2013    Procedure: DILATATION AND EVACUATION;  Surgeon: Brandy KyleSheronette A [name], MD;  Location: [facility];  Service: Gynecology;  Laterality: N/A;  Also send tissue for chromosomal analysis.   Family History: family history includes Birth defects in her cousin and father; Cancer in her paternal grandfather; Heart disease in her paternal grandfather; Migraines in her mother. Social History:  reports that she has never smoked. She has never used smokeless tobacco. She reports that she drinks alcohol. She reports that she does not use illicit drugs.   Prenatal Transfer Tool  Maternal Diabetes: No Genetic Screening: Declined Maternal Ultrasounds/Referrals: Normal Fetal Ultrasounds or other Referrals:  Fetal echo Maternal Substance Abuse:  No Significant Maternal Medications:  Meds include: Other: heparin started @ 35 wks, lovenox prior Significant Maternal Lab Results:  Lab values include: Group B Strep  negative Other Comments:  factor V leiden heterozygous,  MTFHR deficiency, transient fetal PAC's w/ nl fetal echo @ 33 1/2 weeks  Review of Systems  All other systems reviewed and are negative.   Dilation: 4.5 Effacement (%): 90 Station: -2 Exam by:: K.Wilson,RN Blood pressure 118/64, pulse 75, temperature 97.7 F (36.5 C), temperature source Oral, resp. rate 18, height 5\' 5"  (1.651 m), weight 68.04 kg (150 lb), last menstrual period 01/05/2014. Maternal Exam:  Uterine Assessment: Contraction strength is mild.  Abdomen: Patient reports no abdominal tenderness. Fetal presentation: vertex  Introitus: Normal vulva.   Physical Exam  Constitutional: She is oriented to person, place, and time. She appears well-developed and well-nourished.  HENT:  Head: Atraumatic.  Eyes: EOM are normal.  Neck: Neck supple.  Respiratory: Breath sounds normal.  GI: Soft.  Neurological: She is alert and oriented to person, place, and time.  Skin: Skin is warm and dry.  Psychiatric: She has a normal mood and affect.   VE : loose 4 cm/100/-2 LOA asynclytic Amniotomy. IUPC placed Prenatal labs: ABO, Rh: O/Positive/-- (04/01 0000) Antibody: Negative (04/01 0000) Rubella: Nonimmune (04/01 0000) RPR: Nonreactive (04/01 0000)  HBsAg: Negative (04/01 0000)  HIV: Non-reactive (04/01 0000)  GBS: Negative (10/01 0000)   Assessment/Plan: Active Labor Previous C/S desires VBAC Term gestation Factor V leiden deficiency P) admit. Routine labs. Amniotomy. Pitocin prn. Epidural prn. Exaggerated right sims. Resume anticoagulant postpartum   Brandy Kaiser A 10/09/2014, 7:23 PM

## 2014-10-09 NOTE — Anesthesia Procedure Notes (Signed)
Epidural Patient location during procedure: OB Start time: 10/09/2014 9:31 PM End time: 10/09/2014 9:35 PM  Staffing Anesthesiologist: Leilani AbleHATCHETT, Juhi Lagrange Performed by: anesthesiologist   Preanesthetic Checklist Completed: patient identified, surgical consent, pre-op evaluation, timeout performed, IV checked, risks and benefits discussed and monitors and equipment checked  Epidural Patient position: sitting Prep: site prepped and draped and DuraPrep Patient monitoring: continuous pulse ox and blood pressure Approach: midline Location: L3-L4 Injection technique: LOR air  Needle:  Needle type: Tuohy  Needle gauge: 17 G Needle length: 9 cm and 9 Needle insertion depth: 5 cm cm Catheter type: closed end flexible Catheter size: 19 Gauge Catheter at skin depth: 10 cm Test dose: negative and Other  Assessment Sensory level: T10 Events: blood not aspirated, injection not painful, no injection resistance, negative IV test and no paresthesia  Additional Notes Reason for block:procedure for pain

## 2014-10-09 NOTE — Telephone Encounter (Signed)
THE CONTRACTIONS ARE NOW TEN MINUTES APART. SHOULD SHE TAKE HER HEPARIN TODAY? VERBAL ORDER AND READ BACK TO DR.GORSUCH- NO HEPARIN AND CONTACT HER OB PHYSICIAN. NOTIFIED PT. SHE VOICES UNDERSTANDING.

## 2014-10-09 NOTE — MAU Note (Signed)
Pt reports she has been having ctx on off since midnight. Got closer and more uncomfortable since about 4pm. Reports some bloody show but denies SROM.

## 2014-10-09 NOTE — Anesthesia Preprocedure Evaluation (Signed)
Anesthesia Evaluation  Patient identified by MRN, date of birth, ID band Patient awake    Reviewed: Allergy & Precautions, H&P , NPO status , Patient's Chart, lab work & pertinent test results  Airway Mallampati: I  TM Distance: >3 FB Neck ROM: full    Dental no notable dental hx.    Pulmonary neg pulmonary ROS,    Pulmonary exam normal       Cardiovascular negative cardio ROS      Neuro/Psych negative psych ROS   GI/Hepatic negative GI ROS, Neg liver ROS,   Endo/Other  negative endocrine ROS  Renal/GU negative Renal ROS     Musculoskeletal   Abdominal Normal abdominal exam  (+)   Peds  Hematology   Anesthesia Other Findings   Reproductive/Obstetrics (+) Pregnancy                             Anesthesia Physical Anesthesia Plan  ASA: II  Anesthesia Plan: Epidural   Post-op Pain Management:    Induction:   Airway Management Planned:   Additional Equipment:   Intra-op Plan:   Post-operative Plan:   Informed Consent: I have reviewed the patients History and Physical, chart, labs and discussed the procedure including the risks, benefits and alternatives for the proposed anesthesia with the patient or authorized representative who has indicated his/her understanding and acceptance.     Plan Discussed with:   Anesthesia Plan Comments:         Anesthesia Quick Evaluation

## 2014-10-09 NOTE — Progress Notes (Signed)
S: breathing with ctx  O: VE unchanged Clear fluid. IUPC/ISE place  Tracing: baseline 145 (+) variable decel ctq 2 mins  IMP: Active phase Prev C/S Variable due to cord compression.  P) Epidural amnioinfusion

## 2014-10-10 ENCOUNTER — Encounter (HOSPITAL_COMMUNITY): Payer: Self-pay | Admitting: *Deleted

## 2014-10-10 LAB — TYPE AND SCREEN
ABO/RH(D): O POS
Antibody Screen: NEGATIVE

## 2014-10-10 LAB — CREATININE, SERUM
Creatinine, Ser: 0.53 mg/dL (ref 0.50–1.10)
GFR calc non Af Amer: >90 mL/min (ref >90)

## 2014-10-10 LAB — CBC
HEMATOCRIT: 30.5 % — AB (ref 36.0–46.0)
Hemoglobin: 10.4 g/dL — ABNORMAL LOW (ref 12.0–15.0)
MCH: 31.9 pg (ref 26.0–34.0)
MCHC: 34.1 g/dL (ref 30.0–36.0)
MCV: 93.6 fL (ref 78.0–100.0)
Platelets: 150 10*3/uL (ref 150–400)
RBC: 3.26 MIL/uL — ABNORMAL LOW (ref 3.87–5.11)
RDW: 13.7 % (ref 11.5–15.5)
WBC: 21.7 10*3/uL — ABNORMAL HIGH (ref 4.0–10.5)

## 2014-10-10 LAB — RPR

## 2014-10-10 LAB — ABO/RH: ABO/RH(D): O POS

## 2014-10-10 MED ORDER — SENNOSIDES-DOCUSATE SODIUM 8.6-50 MG PO TABS
2.0000 | ORAL_TABLET | ORAL | Status: DC
  Administered 2014-10-10: 2 via ORAL
  Filled 2014-10-10: qty 2

## 2014-10-10 MED ORDER — PRENATAL MULTIVITAMIN CH
1.0000 | ORAL_TABLET | Freq: Every day | ORAL | Status: DC
  Administered 2014-10-10 – 2014-10-11 (×2): 1 via ORAL
  Filled 2014-10-10 (×2): qty 1

## 2014-10-10 MED ORDER — ONDANSETRON HCL 4 MG/2ML IJ SOLN: 4.0000 mg | INTRAMUSCULAR | Status: DC | PRN

## 2014-10-10 MED ORDER — ZOLPIDEM TARTRATE 5 MG PO TABS: 5.0000 mg | ORAL_TABLET | Freq: Every evening | ORAL | Status: DC | PRN

## 2014-10-10 MED ORDER — DIPHENHYDRAMINE HCL 25 MG PO CAPS: 25.0000 mg | ORAL_CAPSULE | Freq: Four times a day (QID) | ORAL | Status: DC | PRN

## 2014-10-10 MED ORDER — ONDANSETRON HCL 4 MG PO TABS: 4.0000 mg | ORAL_TABLET | ORAL | Status: DC | PRN

## 2014-10-10 MED ORDER — BENZOCAINE-MENTHOL 20-0.5 % EX AERO
1.0000 "application " | INHALATION_SPRAY | CUTANEOUS | Status: DC | PRN
  Administered 2014-10-10: 1 via TOPICAL
  Filled 2014-10-10: qty 56

## 2014-10-10 MED ORDER — OXYCODONE-ACETAMINOPHEN 5-325 MG PO TABS: 2.0000 | ORAL_TABLET | ORAL | Status: DC | PRN

## 2014-10-10 MED ORDER — ENOXAPARIN SODIUM 40 MG/0.4ML ~~LOC~~ SOLN
40.0000 mg | SUBCUTANEOUS | Status: DC
  Administered 2014-10-10: 40 mg via SUBCUTANEOUS
  Filled 2014-10-10 (×2): qty 0.4

## 2014-10-10 MED ORDER — LANOLIN HYDROUS EX OINT: TOPICAL_OINTMENT | CUTANEOUS | Status: DC | PRN

## 2014-10-10 MED ORDER — OXYCODONE-ACETAMINOPHEN 5-325 MG PO TABS
1.0000 | ORAL_TABLET | ORAL | Status: DC | PRN
  Administered 2014-10-10 – 2014-10-11 (×4): 1 via ORAL
  Filled 2014-10-10 (×5): qty 1

## 2014-10-10 MED ORDER — SIMETHICONE 80 MG PO CHEW: 80.0000 mg | CHEWABLE_TABLET | ORAL | Status: DC | PRN

## 2014-10-10 MED ORDER — FERROUS SULFATE 325 (65 FE) MG PO TABS
325.0000 mg | ORAL_TABLET | Freq: Two times a day (BID) | ORAL | Status: DC
  Administered 2014-10-10 – 2014-10-11 (×3): 325 mg via ORAL
  Filled 2014-10-10 (×3): qty 1

## 2014-10-10 MED ORDER — IBUPROFEN 600 MG PO TABS
600.0000 mg | ORAL_TABLET | Freq: Four times a day (QID) | ORAL | Status: DC
  Administered 2014-10-10 – 2014-10-11 (×5): 600 mg via ORAL
  Filled 2014-10-10 (×5): qty 1

## 2014-10-10 MED ORDER — FOLIC ACID 1 MG PO TABS
4.0000 mg | ORAL_TABLET | Freq: Every day | ORAL | Status: DC
  Administered 2014-10-10 – 2014-10-11 (×2): 4 mg via ORAL
  Filled 2014-10-10 (×3): qty 4

## 2014-10-10 MED ORDER — WITCH HAZEL-GLYCERIN EX PADS: 1.0000 "application " | MEDICATED_PAD | CUTANEOUS | Status: DC | PRN

## 2014-10-10 MED ORDER — DIBUCAINE 1 % RE OINT: 1.0000 "application " | TOPICAL_OINTMENT | RECTAL | Status: DC | PRN

## 2014-10-10 NOTE — Progress Notes (Signed)
S: comfortable  O: Pitocin VE per RN  Complete  Tracing: baseline 155 (+) variables Ctx q 2-3 mins  IMP: Complete Previous C/S Factor V leiden deficiency P) labor vtx down

## 2014-10-10 NOTE — Lactation Note (Signed)
This note was copied from the chart of Brandy Kaiser. Lactation Consultation Note  P2, Difficult latch with first child.  Pumped for 8 weeks and latched briefly and mother decided to stop then. Mother states this baby has already been a different experience.  Will be staying home with this child. Mother has been taught hand expression. Discussed cluster feeding, how to unlatch and be sure baby is deep enough on breast. Baby sleeping in crib at this time.  Encouraged STS and burping between breasts. Mom encouraged to feed baby 8-12 times/24 hours and with feeding cues.  Mom made aware of O/P services, breastfeeding support groups, community resources, and our phone # for post-discharge questions.    Patient Name: Brandy Army Fossaerri Remache MVHQI'OToday's Date: 10/10/2014 Reason for consult: Initial assessment   Maternal Data Has patient been taught Hand Expression?: Yes  Feeding Feeding Type: Breast Fed Length of feed: 15 min  LATCH Score/Interventions                      Lactation Tools Discussed/Used     Consult Status Consult Status: Follow-up Date: 10/11/14 Follow-up type: In-patient    Dahlia ByesBerkelhammer, Ruth [facility] 10/10/2014, 5:14 PM

## 2014-10-11 DIAGNOSIS — D62 Acute posthemorrhagic anemia: Secondary | ICD-10-CM | POA: Diagnosis not present

## 2014-10-11 DIAGNOSIS — O99892 Other specified diseases and conditions complicating childbirth: Secondary | ICD-10-CM

## 2014-10-11 DIAGNOSIS — O9989 Other specified diseases and conditions complicating pregnancy, childbirth and the puerperium: Secondary | ICD-10-CM

## 2014-10-11 DIAGNOSIS — Z283 Underimmunization status: Secondary | ICD-10-CM

## 2014-10-11 DIAGNOSIS — Z2839 Other underimmunization status: Secondary | ICD-10-CM

## 2014-10-11 LAB — CBC
HCT: 29 % — ABNORMAL LOW (ref 36.0–46.0)
HEMOGLOBIN: 9.9 g/dL — AB (ref 12.0–15.0)
MCH: 32.5 pg (ref 26.0–34.0)
MCHC: 34.1 g/dL (ref 30.0–36.0)
MCV: 95.1 fL (ref 78.0–100.0)
Platelets: 143 10*3/uL — ABNORMAL LOW (ref 150–400)
RBC: 3.05 MIL/uL — ABNORMAL LOW (ref 3.87–5.11)
RDW: 14.1 % (ref 11.5–15.5)
WBC: 15.3 10*3/uL — ABNORMAL HIGH (ref 4.0–10.5)

## 2014-10-11 MED ORDER — ENOXAPARIN SODIUM 40 MG/0.4ML ~~LOC~~ SOLN: 40.0000 mg | SUBCUTANEOUS | Status: AC

## 2014-10-11 MED ORDER — MEASLES, MUMPS & RUBELLA VAC ~~LOC~~ INJ
0.5000 mL | INJECTION | Freq: Once | SUBCUTANEOUS | Status: AC
  Administered 2014-10-11: 0.5 mL via SUBCUTANEOUS
  Filled 2014-10-11 (×2): qty 0.5

## 2014-10-11 MED ORDER — IBUPROFEN 600 MG PO TABS: 600.0000 mg | ORAL_TABLET | Freq: Four times a day (QID) | ORAL | Status: AC

## 2014-10-11 MED ORDER — OXYCODONE-ACETAMINOPHEN 5-325 MG PO TABS: 1.0000 | ORAL_TABLET | ORAL | Status: DC | PRN

## 2014-10-11 NOTE — Plan of Care (Signed)
Problem: Discharge Progression Outcomes Goal: MMR given as ordered Outcome: Completed/Met Date Met:  10/11/14

## 2014-10-11 NOTE — Plan of Care (Signed)
Problem: Discharge Progression Outcomes Goal: Barriers To Progression Addressed/Resolved Outcome: Not Applicable Date Met:  32/25/67 Goal: Activity appropriate for discharge plan Outcome: Completed/Met Date Met:  10/11/14 Goal: Tolerating diet Outcome: Completed/Met Date Met:  20/91/98 Goal: Complications resolved/controlled Outcome: Not Applicable Date Met:  01/31/78 Goal: Pain controlled with appropriate interventions Outcome: Completed/Met Date Met:  10/11/14 Goal: Afebrile, VS remain stable at discharge Outcome: Completed/Met Date Met:  10/11/14 Goal: Remove staples per MD order Outcome: Not Applicable Date Met:  81/02/54 Goal: Discharge plan in place and appropriate Outcome: Completed/Met Date Met:  10/11/14 Goal: Other Discharge Outcomes/Goals Outcome: Not Applicable Date Met:  86/28/24

## 2014-10-11 NOTE — Anesthesia Postprocedure Evaluation (Signed)
Anesthesia Post Note  Patient: Brandy Kaiser  Procedure(s) Performed: * No procedures listed *  Anesthesia type: Epidural  Patient location: Mother/Baby  Post pain: Pain level controlled  Post assessment: Post-op Vital signs reviewed  Last Vitals:  Filed Vitals:   10/11/14 0523  BP: 94/56  Pulse: 75  Temp: 36.9 C  Resp: 20    Post vital signs: Reviewed  Level of consciousness:alert  Complications: No apparent anesthesia complications

## 2014-10-11 NOTE — Progress Notes (Addendum)
  PPD #1- SVD  Subjective:   Reports feeling well, desires early discharge Tolerating po/ No nausea or vomiting Bleeding is light Pain controlled with Motrin and Percocet Up ad lib / ambulatory / voiding without problems Newborn: breastfeeding  / Circumcision: done   Objective:   VS: VS:  Filed Vitals:   10/10/14 1400 10/10/14 1700 10/10/14 2049 10/11/14 0523  BP: 104/69 99/65 106/60 94/56  Pulse: 72 73 90 75  Temp: 98.6 F (37 C) 98.3 F (36.8 C) 98.4 F (36.9 C) 98.5 F (36.9 C)  TempSrc: Oral Oral Oral Oral  Resp: _0 Height:      Weight:        LABS:  Recent Labs  10/10/14 0917 10/11/14 0612  WBC 21.7* 15.3*  HGB 10.4* 9.9*  PLT 150 143*   Blood type: --/--/O POS, O POS (10/30 1830) Rubella: Nonimmune (04/01 0000)                I&O: Intake/Output      10/31 0701 - 11/01 0700 11/01 0701 - 11/02 0700   Urine (mL/kg/hr) 500 (0.3)    Blood     Total Output 500     Net -500            Physical Exam: Alert and oriented X3 Abdomen: soft, non-tender, non-distended  Fundus: firm, non-tender, U-2 Perineum: Well approximated, no significant erythema, edema, or drainage; healing well. Lochia: small Extremities: No edema, no calf pain or tenderness    Assessment: PPD #1  G4P2022/ S/P: VBAC, 2nd degree laceration Factor V Leiden-on Lovenox ABL anemia Rubella non-immune Doing well - stable for discharge home   Plan: MMR prior to d/c Discharge home RX's:  Ibuprofen 64m po Q 6 hrs prn pain #30 Refill x 0 Niferex 1526mpo BID  Percocet 5/325 1 to 2 po Q 4 hrs prn pain #30 Refill x 0 Continue Lovenox Hematology f/u in 4 weeks Routine pp visit in [employer] [employer]Gyn booklet given    BHJulianne HandlerN MSN, CNM 10/11/2014, 12:33 PM

## 2014-10-11 NOTE — Discharge Summary (Signed)
Obstetric Discharge Summary Reason for Admission: onset of labor Prenatal Course: previous CS for breech, factor V leiden on heparin Intrapartum Procedures: VBAC, AROM, epidural Postpartum Procedures: Rubella Ig Complications-Operative and Postpartum: 2nd degree perineal laceration HEMOGLOBIN  Date Value Ref Range Status  10/11/2014 9.9* 12.0 - 15.0 g/dL Final   HGB  Date Value Ref Range Status  09/17/2014 10.9* 11.6 - 15.9 g/dL Final   HCT  Date Value Ref Range Status  10/11/2014 29.0* 36.0 - 46.0 % Final  09/17/2014 33.1* 34.8 - 46.6 % Final    Physical Exam:  General: alert and cooperative Lochia: appropriate Uterine Fundus: firm Incision: healing well, no significant drainage, no dehiscence, no significant erythema DVT Evaluation: No evidence of DVT seen on physical exam. Negative Homan's sign. No cords or calf tenderness. No significant calf/ankle edema.  Discharge Diagnoses: Term Pregnancy-delivered  Discharge Information: Date: 10/11/2014 Activity: pelvic rest Diet: routine Medications: PNV, Ibuprofen, Colace, Iron and Percocet Condition: stable Instructions: refer to practice specific booklet Discharge to: home Follow-up Information    Follow up with COUSINS,SHERONETTE A, MD. Schedule an appointment as soon as possible for a visit in 6 weeks.   Specialty:  Obstetrics and Gynecology   Contact information:   [address] [place] [id number] [phone]       Newborn Data: Live born female on 10/10/14 Birth Weight: 8 lb 3 oz (3714 g) APGAR: 8, 9  Home with mother.  Brandy Kaiser, N 10/11/2014, 9:25 PM

## 2014-10-12 ENCOUNTER — Encounter (HOSPITAL_COMMUNITY): Payer: Self-pay | Admitting: *Deleted

## 2014-10-12 ENCOUNTER — Inpatient Hospital Stay (HOSPITAL_COMMUNITY): Admission: RE | Admit: 2014-10-12 | Payer: BC Managed Care – PPO | Source: Ambulatory Visit

## 2014-11-12 ENCOUNTER — Other Ambulatory Visit (HOSPITAL_BASED_OUTPATIENT_CLINIC_OR_DEPARTMENT_OTHER): Payer: BC Managed Care – PPO

## 2014-11-12 ENCOUNTER — Ambulatory Visit (HOSPITAL_BASED_OUTPATIENT_CLINIC_OR_DEPARTMENT_OTHER): Payer: BC Managed Care – PPO | Admitting: Hematology and Oncology

## 2014-11-12 ENCOUNTER — Encounter: Payer: Self-pay | Admitting: Hematology and Oncology

## 2014-11-12 VITALS — BP 99/62 | HR 66 | Temp 97.7°F | Resp 17 | Ht 66.0 in | Wt 129.7 lb

## 2014-11-12 DIAGNOSIS — E7212 Methylenetetrahydrofolate reductase deficiency: Secondary | ICD-10-CM

## 2014-11-12 DIAGNOSIS — D6852 Prothrombin gene mutation: Secondary | ICD-10-CM

## 2014-11-12 DIAGNOSIS — D6851 Activated protein C resistance: Secondary | ICD-10-CM

## 2014-11-12 DIAGNOSIS — O99112 Other diseases of the blood and blood-forming organs and certain disorders involving the immune mechanism complicating pregnancy, second trimester: Principal | ICD-10-CM

## 2014-11-12 DIAGNOSIS — D6859 Other primary thrombophilia: Secondary | ICD-10-CM

## 2014-11-12 DIAGNOSIS — D649 Anemia, unspecified: Secondary | ICD-10-CM

## 2014-11-12 DIAGNOSIS — Z1589 Genetic susceptibility to other disease: Secondary | ICD-10-CM

## 2014-11-12 LAB — CBC WITH DIFFERENTIAL/PLATELET
BASO%: 0.8 % (ref 0.0–2.0)
Basophils Absolute: 0.1 10*3/uL (ref 0.0–0.1)
EOS ABS: 0.1 10*3/uL (ref 0.0–0.5)
EOS%: 1.2 % (ref 0.0–7.0)
HCT: 38.7 % (ref 34.8–46.6)
HGB: 12.5 g/dL (ref 11.6–15.9)
LYMPH%: 24.8 % (ref 14.0–49.7)
MCH: 30.2 pg (ref 25.1–34.0)
MCHC: 32.3 g/dL (ref 31.5–36.0)
MCV: 93.4 fL (ref 79.5–101.0)
MONO#: 0.9 10*3/uL (ref 0.1–0.9)
MONO%: 10.8 % (ref 0.0–14.0)
NEUT%: 62.4 % (ref 38.4–76.8)
NEUTROS ABS: 5.2 10*3/uL (ref 1.5–6.5)
PLATELETS: 213 10*3/uL (ref 145–400)
RBC: 4.15 10*6/uL (ref 3.70–5.45)
RDW: 13.1 % (ref 11.2–14.5)
WBC: 8.4 10*3/uL (ref 3.9–10.3)
lymph#: 2.1 10*3/uL (ref 0.9–3.3)

## 2014-11-12 LAB — BASIC METABOLIC PANEL (CC13)
ANION GAP: 10 meq/L (ref 3–11)
BUN: 12.9 mg/dL (ref 7.0–26.0)
CALCIUM: 9.7 mg/dL (ref 8.4–10.4)
CO2: 24 meq/L (ref 22–29)
CREATININE: 0.8 mg/dL (ref 0.6–1.1)
Chloride: 108 mEq/L (ref 98–109)
EGFR: >90 mL/min/{1.73_m2} (ref >90)
GLUCOSE: 92 mg/dL (ref 70–140)
Potassium: 4.1 mEq/L (ref 3.5–5.1)
Sodium: 142 mEq/L (ref 136–145)

## 2014-11-12 NOTE — Progress Notes (Signed)
[facility] OFFICE PROGRESS NOTE  No PCP Per Patient SUMMARY OF HEMATOLOGIC HISTORY: This is a patient who was seen before for history of recurrent miscarriages, factor V Leiden mutation was referred here for prophylactic Lovenox injection prior to anticipated delivery of her second child. The patient have 1 normal pregnancy. Subsequently, she had 2 miscarriages, first one in the first trimester and second one around 10 weeks. Both gestations showed normal cytogenetics. Thrombophilia workup showed she was positive for factor V Leiden mutation, positive for MTHFR mutation and low protein C level. She is started on Lovenox 40 mg subcutaneous daily. On 09/17/2014, a decision was made to switch her to unfractionated subcutaneous heparin injection Secondly, she has healthy delivery of her second child. INTERVAL HISTORY: Brandy Kaiser 33 y.o. female returns for further follow-up. She is feeling well. She is currently more than 1 month post-partum from the delivery of her son. She had normal vaginal delivery and is currently active. She denies bleeding.  I have reviewed the past medical history, past surgical history, social history and family history with the patient and they are unchanged from previous note.  ALLERGIES:  has No Known Allergies.  MEDICATIONS:  Current Outpatient Prescriptions  Medication Sig Dispense Refill  . enoxaparin (LOVENOX) 40 MG/0.4ML injection Inject 0.4 mLs (40 mg total) into the skin daily. 0 Syringe   . ferrous sulfate 325 (65 FE) MG tablet Take 325 mg by mouth daily with breakfast.     . Fluconazole (DIFLUCAN PO) Take 1 tablet by mouth daily. Until bottle is finished    . folic acid (FOLVITE) 1 MG tablet Take 4 mg by mouth daily.     Marland Kitchen ibuprofen (ADVIL,MOTRIN) 600 MG tablet Take 1 tablet (600 mg total) by mouth every 6 (six) hours. 30 tablet 0  . Prenatal Vit-Fe Fumarate-FA (PRENATAL MULTIVITAMIN) TABS tablet Take 1 tablet by mouth at bedtime.     .  Probiotic Product (PROBIOTIC DAILY PO) Take 1 capsule by mouth daily.    . Pyridoxine HCl (VITAMIN B-6 PO) Take 1 tablet by mouth 3 (three) times daily.     No current facility-administered medications for this visit.     REVIEW OF SYSTEMS:   Constitutional: Denies fevers, chills or night sweats Eyes: Denies blurriness of vision Ears, nose, mouth, throat, and face: Denies mucositis or sore throat Respiratory: Denies cough, dyspnea or wheezes Cardiovascular: Denies palpitation, chest discomfort or lower extremity swelling Gastrointestinal:  Denies nausea, heartburn or change in bowel habits Skin: Denies abnormal skin rashes Lymphatics: Denies new lymphadenopathy or easy bruising Neurological:Denies numbness, tingling or new weaknesses Behavioral/Psych: Mood is stable, no new changes  All other systems were reviewed with the patient and are negative.  PHYSICAL EXAMINATION: ECOG PERFORMANCE STATUS: 0 - Asymptomatic  Filed Vitals:   11/12/14 1035  BP: 99/62  Pulse: 66  Temp: 97.7 F (36.5 C)  Resp: 17   Filed Weights   11/12/14 1035  Weight: 129 lb 11.2 oz (58.832 kg)    GENERAL:alert, no distress and comfortable SKIN: skin color, texture, turgor are normal, no rashes or significant lesions EYES: normal, Conjunctiva are pink and non-injected, sclera clear Musculoskeletal:no cyanosis of digits and no clubbing  NEURO: alert & oriented x 3 with fluent speech, no focal motor/sensory deficits  LABORATORY DATA:  I have reviewed the data as listed Results for orders placed or performed in visit on 11/12/14 (from the past 48 hour(s))  CBC with Differential     Status: None  Collection Time: 11/12/14 10:23 AM  Result Value Ref Range   WBC 8.4 3.9 - 10.3 10e3/uL   NEUT# 5.2 1.5 - 6.5 10e3/uL   HGB 12.5 11.6 - 15.9 g/dL   HCT 38.7 34.8 - 46.6 %   Platelets 213 145 - 400 10e3/uL   MCV 93.4 79.5 - 101.0 fL   MCH 30.2 25.1 - 34.0 pg   MCHC 32.3 31.5 - 36.0 g/dL   RBC 4.15 3.70 -  5.45 10e6/uL   RDW 13.1 11.2 - 14.5 %   lymph# 2.1 0.9 - 3.3 10e3/uL   MONO# 0.9 0.1 - 0.9 10e3/uL   Eosinophils Absolute 0.1 0.0 - 0.5 10e3/uL   Basophils Absolute 0.1 0.0 - 0.1 10e3/uL   NEUT% 62.4 38.4 - 76.8 %   LYMPH% 24.8 14.0 - 49.7 %   MONO% 10.8 0.0 - 14.0 %   EOS% 1.2 0.0 - 7.0 %   BASO% 0.8 0.0 - 2.0 %  Basic metabolic panel     Status: None   Collection Time: 11/12/14 10:23 AM  Result Value Ref Range   Sodium 142 136 - 145 mEq/L   Potassium 4.1 3.5 - 5.1 mEq/L   Chloride 108 98 - 109 mEq/L   CO2 24 22 - 29 mEq/L   Glucose 92 70 - 140 mg/dl   BUN 12.9 7.0 - 26.0 mg/dL   Creatinine 0.8 0.6 - 1.1 mg/dL   Calcium 9.7 8.4 - 10.4 mg/dL   Anion Gap 10 3 - 11 mEq/L   EGFR >90 >90 ml/min/1.73 m2    Comment: eGFR is calculated using the CKD-EPI Creatinine Equation (2009)    Lab Results  Component Value Date   WBC 8.4 11/12/2014   HGB 12.5 11/12/2014   HCT 38.7 11/12/2014   MCV 93.4 11/12/2014   PLT 213 11/12/2014   ASSESSMENT & PLAN:  Heterozygous factor V Leiden mutation She did well with prophylactic Lovenox injection and heparin injection peripartum period She never suffered from blood clots.  One of the main issue we discussed today included the role of screening other family members for thrombophilia disorder. At present time, I would not recommend testing the patient's family members as it would not benefit them.  Thrombophilia disorder is a genetic predisposition which increases an individual's risk for a thrombotic event, NOT a disease.  We discussed the implications of genetic screening including the possibility of uninsurability, costs involved, emotional distress and possible discrimination at various levels for the affected individual.  Rather than genetic screening, one can possibly benefit from genetic counseling or dissemination of appropriate reading materials to educate other family members.  Finally, at the end of our consultation today, I reinforced the  importance of preventive strategies such as avoiding hormonal supplement, avoiding cigarette smoking, keeping up-to-date with screening programs for early cancer detection, frequent ambulation for long distance travel and aggressive DVT prophylaxis in all surgical settings.  I have not made a return appointment for the patient to come back. I would be happy to assist in perioperative DVT management in the future for elective procedures.       MTHFR mutation We discussed the role of chronic folic acid supplement. MTHFR mutation can predispose her to increased risk of coronary artery disease. I recommend consideration for 81 mg aspirin therapy in the future.   All questions were answered. The patient knows to call the clinic with any problems, questions or concerns. No barriers to learning was detected.  I spent 25 minutes counseling the  patient face to face. The total time spent in the appointment was 30 minutes and more than 50% was on counseling.     [facility], Iisha Soyars, MD 11/12/2014 9:04 PM

## 2014-11-12 NOTE — Assessment & Plan Note (Signed)
We discussed the role of chronic folic acid supplement. MTHFR mutation can predispose her to increased risk of coronary artery disease. I recommend consideration for 81 mg aspirin therapy in the future.

## 2014-11-12 NOTE — Assessment & Plan Note (Signed)
She did well with prophylactic Lovenox injection and heparin injection peripartum period She never suffered from blood clots.  One of the main issue we discussed today included the role of screening other family members for thrombophilia disorder. At present time, I would not recommend testing the patient's family members as it would not benefit them.  Thrombophilia disorder is a genetic predisposition which increases an individual's risk for a thrombotic event, NOT a disease.  We discussed the implications of genetic screening including the possibility of uninsurability, costs involved, emotional distress and possible discrimination at various levels for the affected individual.  Rather than genetic screening, one can possibly benefit from genetic counseling or dissemination of appropriate reading materials to educate other family members.  Finally, at the end of our consultation today, I reinforced the importance of preventive strategies such as avoiding hormonal supplement, avoiding cigarette smoking, keeping up-to-date with screening programs for early cancer detection, frequent ambulation for long distance travel and aggressive DVT prophylaxis in all surgical settings.  I have not made a return appointment for the patient to come back. I would be happy to assist in perioperative DVT management in the future for elective procedures.

## 2014-11-21 ENCOUNTER — Other Ambulatory Visit: Payer: Self-pay | Admitting: Hematology and Oncology

## 2015-12-31 IMAGING — CR DG SINUSES 1-2V
1 series · 1 of 1 positions shown · non-contrast
Comparison: None.

CLINICAL DATA: Headache

EXAM:
PARANASAL SINUSES - 1-2 VIEW

[waters]
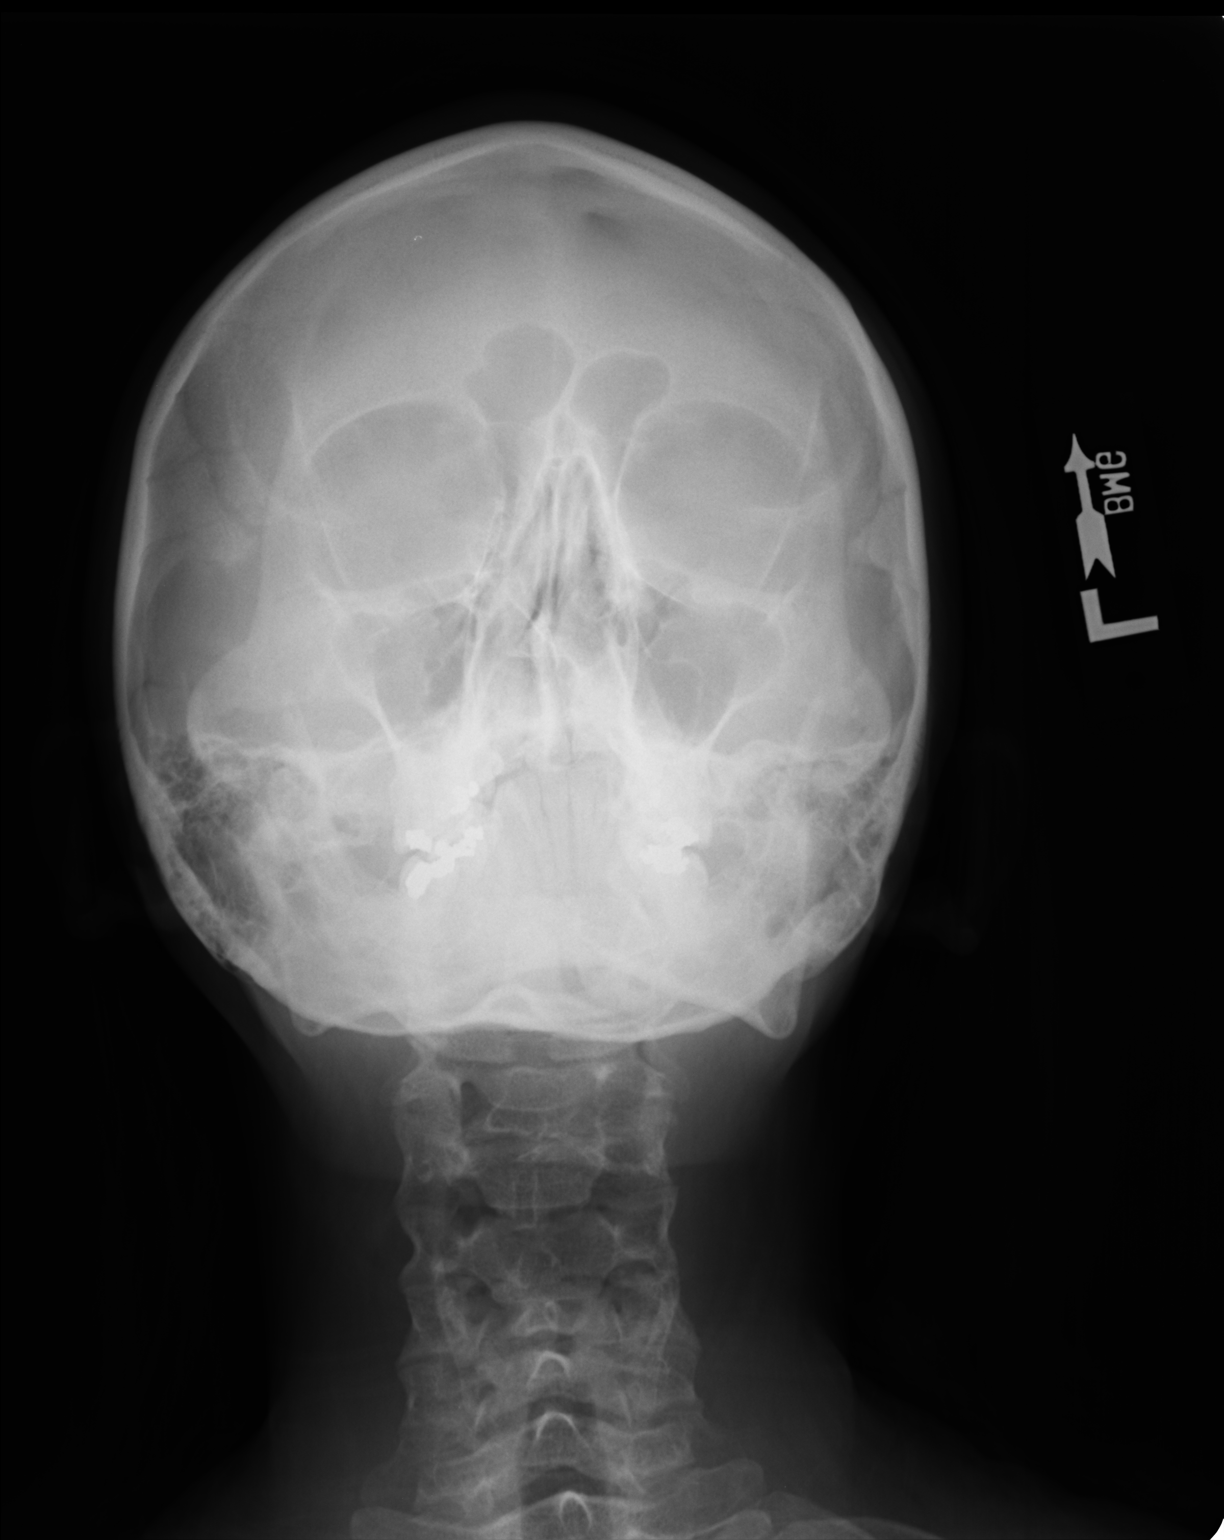

[1 of 1 positions shown; findings below may reference images not displayed]

FINDINGS: The paranasal sinus are aerated. There is no evidence of sinus
opacification air-fluid levels or mucosal thickening. No significant
bone abnormalities are seen. Examination is suboptimal due to single
view technique which does not optimally assess the paranasal
sinuses.
IMPRESSION: Negative.
